# Patient Record
Sex: Female | Born: 1950
Health system: Southern US, Community
[De-identification: ages and names within clinical notes are randomized; demographics above are authoritative.]

## PROBLEM LIST (undated history)

## (undated) DIAGNOSIS — Z8049 Family history of malignant neoplasm of other genital organs: Secondary | ICD-10-CM

## (undated) DIAGNOSIS — Z8042 Family history of malignant neoplasm of prostate: Secondary | ICD-10-CM

## (undated) DIAGNOSIS — Z800 Family history of malignant neoplasm of digestive organs: Secondary | ICD-10-CM

## (undated) HISTORY — DX: Family history of malignant neoplasm of digestive organs: Z80.0

## (undated) HISTORY — PX: REDUCTION MAMMAPLASTY: SUR839

## (undated) HISTORY — DX: Family history of malignant neoplasm of other genital organs: Z80.49

## (undated) HISTORY — DX: Family history of malignant neoplasm of prostate: Z80.42

---

## 1998-06-13 ENCOUNTER — Emergency Department (HOSPITAL_COMMUNITY): Admission: EM | Admit: 1998-06-13 | Discharge: 1998-06-13 | Payer: Self-pay | Admitting: Emergency Medicine

## 1999-12-11 ENCOUNTER — Encounter: Admission: RE | Admit: 1999-12-11 | Discharge: 1999-12-11 | Payer: Self-pay | Admitting: Family Medicine

## 1999-12-11 ENCOUNTER — Encounter: Payer: Self-pay | Admitting: Family Medicine

## 2001-03-10 ENCOUNTER — Ambulatory Visit (HOSPITAL_COMMUNITY): Admission: RE | Admit: 2001-03-10 | Discharge: 2001-03-10 | Payer: Self-pay | Admitting: Gastroenterology

## 2002-02-09 ENCOUNTER — Encounter: Payer: Self-pay | Admitting: Family Medicine

## 2002-02-09 ENCOUNTER — Encounter: Admission: RE | Admit: 2002-02-09 | Discharge: 2002-02-09 | Payer: Self-pay | Admitting: Family Medicine

## 2003-08-22 ENCOUNTER — Encounter: Admission: RE | Admit: 2003-08-22 | Discharge: 2003-08-22 | Payer: Self-pay | Admitting: Family Medicine

## 2005-01-23 ENCOUNTER — Encounter: Admission: RE | Admit: 2005-01-23 | Discharge: 2005-01-23 | Payer: Self-pay | Admitting: Family Medicine

## 2005-08-25 ENCOUNTER — Ambulatory Visit: Admission: RE | Admit: 2005-08-25 | Discharge: 2005-08-25 | Payer: Self-pay | Admitting: Gynecologic Oncology

## 2005-09-08 ENCOUNTER — Inpatient Hospital Stay (HOSPITAL_COMMUNITY): Admission: RE | Admit: 2005-09-08 | Discharge: 2005-09-10 | Payer: Self-pay | Admitting: Obstetrics & Gynecology

## 2005-09-08 ENCOUNTER — Encounter (INDEPENDENT_AMBULATORY_CARE_PROVIDER_SITE_OTHER): Payer: Self-pay | Admitting: *Deleted

## 2005-10-20 ENCOUNTER — Ambulatory Visit: Admission: RE | Admit: 2005-10-20 | Discharge: 2005-10-20 | Payer: Self-pay | Admitting: Gynecologic Oncology

## 2006-04-28 ENCOUNTER — Ambulatory Visit: Admission: RE | Admit: 2006-04-28 | Discharge: 2006-04-28 | Payer: Self-pay | Admitting: Gynecologic Oncology

## 2006-04-28 ENCOUNTER — Other Ambulatory Visit: Admission: RE | Admit: 2006-04-28 | Discharge: 2006-04-28 | Payer: Self-pay | Admitting: Gynecologic Oncology

## 2006-07-19 ENCOUNTER — Ambulatory Visit (HOSPITAL_COMMUNITY): Admission: RE | Admit: 2006-07-19 | Discharge: 2006-07-19 | Payer: Self-pay | Admitting: Gynecologic Oncology

## 2006-08-31 ENCOUNTER — Ambulatory Visit (HOSPITAL_COMMUNITY): Admission: RE | Admit: 2006-08-31 | Discharge: 2006-08-31 | Payer: Self-pay | Admitting: Gynecologic Oncology

## 2006-11-24 ENCOUNTER — Encounter (INDEPENDENT_AMBULATORY_CARE_PROVIDER_SITE_OTHER): Payer: Self-pay | Admitting: Gynecologic Oncology

## 2006-11-24 ENCOUNTER — Ambulatory Visit: Admission: RE | Admit: 2006-11-24 | Discharge: 2006-11-24 | Payer: Self-pay | Admitting: Gynecologic Oncology

## 2006-11-24 ENCOUNTER — Other Ambulatory Visit: Admission: RE | Admit: 2006-11-24 | Discharge: 2006-11-24 | Payer: Self-pay | Admitting: Gynecologic Oncology

## 2007-02-01 ENCOUNTER — Ambulatory Visit (HOSPITAL_COMMUNITY): Admission: RE | Admit: 2007-02-01 | Discharge: 2007-02-01 | Payer: Self-pay | Admitting: Gynecologic Oncology

## 2007-02-08 ENCOUNTER — Other Ambulatory Visit: Admission: RE | Admit: 2007-02-08 | Discharge: 2007-02-08 | Payer: Self-pay | Admitting: Gynecologic Oncology

## 2007-02-08 ENCOUNTER — Encounter (INDEPENDENT_AMBULATORY_CARE_PROVIDER_SITE_OTHER): Payer: Self-pay | Admitting: Gynecologic Oncology

## 2007-02-08 ENCOUNTER — Ambulatory Visit: Admission: RE | Admit: 2007-02-08 | Discharge: 2007-02-08 | Payer: Self-pay | Admitting: Gynecologic Oncology

## 2007-09-27 ENCOUNTER — Encounter (INDEPENDENT_AMBULATORY_CARE_PROVIDER_SITE_OTHER): Payer: Self-pay | Admitting: Gynecologic Oncology

## 2007-09-27 ENCOUNTER — Other Ambulatory Visit: Admission: RE | Admit: 2007-09-27 | Discharge: 2007-09-27 | Payer: Self-pay | Admitting: Gynecologic Oncology

## 2007-09-27 ENCOUNTER — Ambulatory Visit: Admission: RE | Admit: 2007-09-27 | Discharge: 2007-09-27 | Payer: Self-pay | Admitting: Gynecologic Oncology

## 2008-10-10 ENCOUNTER — Other Ambulatory Visit: Admission: RE | Admit: 2008-10-10 | Discharge: 2008-10-10 | Payer: Self-pay | Admitting: Gynecologic Oncology

## 2008-10-10 ENCOUNTER — Ambulatory Visit: Admission: RE | Admit: 2008-10-10 | Discharge: 2008-10-10 | Payer: Self-pay | Admitting: Gynecologic Oncology

## 2008-10-10 ENCOUNTER — Encounter (INDEPENDENT_AMBULATORY_CARE_PROVIDER_SITE_OTHER): Payer: Self-pay | Admitting: Gynecologic Oncology

## 2009-10-10 ENCOUNTER — Other Ambulatory Visit: Admission: RE | Admit: 2009-10-10 | Discharge: 2009-10-10 | Payer: Self-pay | Admitting: Gynecologic Oncology

## 2009-10-10 ENCOUNTER — Ambulatory Visit: Admission: RE | Admit: 2009-10-10 | Discharge: 2009-10-10 | Payer: Self-pay | Admitting: Gynecologic Oncology

## 2010-10-09 ENCOUNTER — Ambulatory Visit: Payer: BC Managed Care – PPO | Attending: Gynecologic Oncology | Admitting: Gynecologic Oncology

## 2010-10-09 DIAGNOSIS — C549 Malignant neoplasm of corpus uteri, unspecified: Secondary | ICD-10-CM | POA: Insufficient documentation

## 2010-10-13 NOTE — Consult Note (Signed)
  NAMEMAKAYLIE, Kathy Drake             ACCOUNT NO.:  [id number]  MEDICAL RECORD NO.:  [id number]           PATIENT TYPE:  LOCATION:                                 FACILITY:  PHYSICIAN:  Laurette Schimke, MD          DATE OF BIRTH:  DATE OF CONSULTATION:  10/09/2010 DATE OF DISCHARGE:                                CONSULTATION   REASON FOR VISIT:  Surveillance for endometrial cancer.  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 No adjuvant therapy was administered, and she has remained without evidence of disease since.  PAST MEDICAL HISTORY:  Stage IA endometrial cancer, April 2007.  PAST SURGICAL HISTORY:  Cesarean section x2, bilateral breast reduction, bilateral tubal ligation, hysterectomy, bilateral salpingo-oophorectomy, bilateral pelvic lymph node dissection in April 2007.  MEDICATIONS:  None.  SOCIAL HISTORY:  Denies tobacco use.  Reports occasional alcohol use. Her daughter will be studying Arabic in [place] of a year.  FAMILY HISTORY:  No interval changes.  REVIEW OF SYSTEMS:  No headache, shortness of breath, cough, back pain, adenopathy, abdominal bloating, changes in appetite.  No bleeding from the vagina, uterus or bladder.  No constipation, diarrhea.  No nausea, vomiting.  No changes in urinary habits.  No unilateral extremity edema. Otherwise 10-point review of systems is negative.  PHYSICAL EXAMINATION:  GENERAL:  Well-developed female in no acute distress. VITAL SIGNS:  Weight 176 pounds, blood pressure 120/68, pulse of 78. CHEST:  Clear to auscultation. HEART:  Regular rate and rhythm.  There is no cervical, subclavicular, or inguinal adenopathy. ABDOMEN:  Obese, soft.  No palpable masses.  No evidence of ascites. BACK:  No CVA tenderness. PELVIC:  Normal external genitalia, Bartholin's, urethral, and Skene's. Atrophic vagina. RECTA:  Good anal sphincter  tone without any masses.  No cul-de-sac nodularity.  External hemorrhoids are noted.  Good anal sphincter tone. Edema 1+ of the lower extremities.  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  She has been advised to follow up with Dr. Arlyce Dice on annual basis.     Laurette Schimke, MD     WB/MEDQ  D:  10/09/2010  T:  10/09/2010  Job:  [id number]  cc:   Ilda Mori, M.D. Fax: [phone]  Telford Nab, R.N. [address] [place], [place] [address]  Electronically Signed by Laurette Schimke MD on 10/13/2010 12:58:25 PM

## 2010-10-21 NOTE — Consult Note (Signed)
NAME:  Kathy Drake, Kathy Drake             ACCOUNT NO.:  [id number]   MEDICAL RECORD NO.:  [id number]          PATIENT TYPE:  OUT   LOCATION:  GYN                          FACILITY:  [facility]   PHYSICIAN:  John T. Kyla Balzarine, M.D.    DATE OF BIRTH:  [birth year]   DATE OF CONSULTATION:  02/08/2007  DATE OF DISCHARGE:                                 CONSULTATION   CHIEF COMPLAINT:  Followup of endometrial cancer and pelvic lymphocyst.   HISTORY OF PRESENT ILLNESS:  This patient was explored and fully staged  of a clear cell carcinoma of the endometrium in April 2007.  She had  negative lymph nodes and no invasion but a poorly differentiated clear  cell carcinoma and was followed without treatment.  Last spring she had  a cystic mass on examination and ultrasound measuring up to 6 x 3.4 cm  and simple cyst thought to be either lymphocyst or seroma.  There was no  evidence of ascites, carcinomatosis or pathologic lymphadenopathy.  She  recently had a followup ultrasound which revealed a decrease in the size  of a largely simple cystic lesion.  She denies problems with lymphedema,  pelvic or abdominal pain, or vaginal bleeding.  She has questions about  colonoscopy screening as her last colonoscopy apparently yielded some  adenomatous polyps.   PAST MEDICAL HISTORY:  1. Bilateral breast reduction.  2. Cesarean section x2.  3. Tubal ligation.  4. Status post endometrial cancer surgery as above.   MEDICATIONS:  None.   ALLERGIES:  CODEINE causes rash.   PERSONAL AND SOCIAL HISTORY:  Married and denies tobacco but admits  social ethanol.   FAMILY HISTORY:  Mother with ovarian cancer at age 41 and subsequent  colon cancer.  Two paternal uncles with lymphoma and one with melanoma.   REVIEW OF SYSTEMS:  The patient is fully active and denies ongoing  issues, with a negative comprehensive system review otherwise.   PHYSICAL EXAMINATION:  VITAL SIGNS:  Weight 178 pounds; vital signs  stable and  afebrile.  GENERAL:  The patient is alert and oriented x3 in no acute distress.  LYMPHATIC SURVEY:  Negative for pathologic lymphadenopathy.  BACK:  No back or CVA tenderness.  ABDOMEN:  Obese, soft and benign with no ascites, mass or tenderness.  No hernias.  EXTREMITIES:  Full strength and range of motion with no edema, cords or  Homan's syndrome.  PELVIC:  External genitalia and BUS normal to inspection and palpation.  Bladder and urethra are well supported, and there are no vaginal mucosal  lesions.  On bimanual and rectovaginal examinations, left anterior  carrier cystic mass approximately 4 cm, nontender and with no associated  nodularity.  Uterus and cervix absent.   LABORATORY DATA:  Pelvic ultrasound obtained on August 26 reveals a  decrease in size of the cystic mass to 3.8 x 2.7 x 3.7 cm.  There  appeared to be acoustic shadowing and internal echo artifact as it was  surrounded by bowel loops.  No solid component identified and no free  fluid.   ASSESSMENT:  1. Endometrial carcinoma, NAD.  2.  Lymphocyst, decreasing in size.   PLAN:  1. The patient will continue follow up at 68-month intervals to      complete 2 years of followup.  2. She will see Dr. Arlyce Dice in 3 months and then return to see [place] in 6      months.      John T. Kyla Balzarine, M.D.  Electronically Signed     JTS/MEDQ  D:  02/08/2007  T:  02/08/2007  Job:  [id number]   cc:   Ilda Mori, M.D.  Fax: [phone]   Telford Nab, R.N.  [address]  [place], [place] [address]

## 2010-10-21 NOTE — Consult Note (Signed)
NAME:  Kathy Drake, Kathy Drake             ACCOUNT NO.:  [id number]   MEDICAL RECORD NO.:  [id number]          PATIENT TYPE:  OUT   LOCATION:  GYN                          FACILITY:  [facility]   PHYSICIAN:  John T. Kyla Balzarine, M.D.    DATE OF BIRTH:  03-Jun-1951   DATE OF CONSULTATION:  DATE OF DISCHARGE:                                 CONSULTATION   CHIEF COMPLAINT:  Followup of endometrial cancer, treated with surgery  alone.   HISTORY OF PRESENT ILLNESS:  This patient was explored and fully staged  of a clear cell carcinoma of the endometrium in April 2007.  She had no  invasion and negative lymph nodes but grade 3 lesion and we elected  followup without further treatment.  She was last seen here in April  2009 and had an interval examination by Dr. Arlyce Dice that was negative.  She relates no vaginal bleeding, change in bowel or bladder function,  pelvic pain, or new back pain.  She denies vaginal bleeding.  An interim  examination was negative.  She has minor concerns of a pulling  pressure sensation when she bends over in the left periumbilical region  but has not noted a hernia.  Of note, CT scan in the past revealed a  small cyst in the left pelvis, followed with no growth and asymptomatic.   PAST MEDICAL HISTORY:  1. Bilateral breast reduction.  2. Cesarean section x2.  3. Tubal ligation.  4. Endometrial cancer surgery as above.  No major comorbidities.   MEDICATIONS:  None.   ALLERGIES:  CODEINE CAUSES RASH.   PERSONAL AND SOCIAL HISTORY:  Married and denies tobacco but admits  social ethanol.   FAMILY HISTORY:  Mother died of ovarian +/- colon cancer.  Two paternal  uncles with lymphoma and one with melanoma.   REVIEW OF SYSTEMS:  Fully active without limitations.  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  Otherwise  negative comprehensive 10-point system review.   EXAM:  Weight 186 pounds and blood pressure 128/80; vital signs  otherwise  stable and afebrile.  The patient is anxious, alert, and  oriented x3 in no acute distress.  LYMPH:  Survey negative for pathologic lymphadenopathy.  BACK:  No spinous or CVA tenderness.  ABDOMEN:  Obese, soft, and benign with well-healed midline incision.  No  hernia, ascites, tenderness, or mass.  The patient was examined standing  and straining with no demonstrable hernia.  EXTREMITIES:  Have full strength and range of motion with no edema,  cords, or Homan's.  PELVIC:  External genitalia and BUS, bladder and urethra normal.  The  vaginal mucosa is clear.  On bimanual and rectovaginal examination,  limited by habitus.  There is some fullness in the left pelvic sidewall  adjacent to the vaginal apex, smooth, nontender, and unchanged from my  examination a year ago, compatible with documented lymphocyst.   ASSESSMENT:  1. Endometrial carcinoma, no evidence of disease.  2. Abdominal discomfort, low grade without clinical evidence for a      hernia.   PLAN:  Patient will see Dr. Arlyce Dice for followup in 6 months and return  here in 1 year.  I would recommend that we obtain a routine surveillance  CT scan prior to her return here; perhaps Dr. Arlyce Dice could make  arrangements for this next spring after her evaluation by him.      John T. Kyla Balzarine, M.D.  Electronically Signed     JTS/MEDQ  D:  10/10/2008  T:  10/10/2008  Job:  [id number]   cc:   Ilda Mori, M.D.  Fax: [phone]   Telford Nab, R.N.  [address]  [place], [place] [address]

## 2010-10-21 NOTE — Consult Note (Signed)
NAME:  Kathy Drake, Kathy Drake             ACCOUNT NO.:  [id number]   MEDICAL RECORD NO.:  [id number]          PATIENT TYPE:  OUT   LOCATION:  GYN                          FACILITY:  [facility]   PHYSICIAN:  John T. Kyla Balzarine, M.D.    DATE OF BIRTH:  Aug 07, 1950   DATE OF CONSULTATION:  09/27/2007  DATE OF DISCHARGE:                                 CONSULTATION   CHIEF COMPLAINT:  Follow up of clear cell endometrial adenocarcinoma.   HISTORY OF PRESENT ILLNESS:  This patient was explored and fully staged  of a clear cell carcinoma of the endometrium in April 2007 with negative  lymph nodes and no invasion but a grade 3 lesion.  She has small simple  cyst in the left pelvis found on a CT scan which has been followed with  no growth and no problems with lymphocyst.  Since her last evaluation,  she relates no vaginal bleeding, change in bowel or her bladder  function, pelvic pain or new back pain.  She is concerned about weight  gain.  Interim examination was negative.   PAST MEDICAL HISTORY:  1. Bilateral breast reduction.  2. Cesarean section x2.  3. Tubal ligation.  4. The endometrial cancer surgery as above.   MEDICATIONS:  None.   ALLERGIES:  CODEINE caused rash.   PERSONAL AND SOCIAL HISTORY:  Married and denies tobacco but admits  social ethanol.   FAMILY HISTORY:  Mother with ovarian cancer at age 15 and subsequent  colon cancer.  Two paternal uncles with lymphoma and one with melanoma.   REVIEW OF SYSTEMS:  Fully active and denies ongoing issues with a  negative comprehensive 10-point system review.   PHYSICAL EXAMINATION:  VITAL SIGNS:  Weight 180 pounds, blood pressure  115/67 and vital signs stable.  GENERAL:  The patient is alert and oriented x3 in no acute distress.  LYMPH SURVEY:  Negative for pathologic lymphadenopathy.  BACK:  No spinous or CVA tenderness.  ABDOMEN:  Obese, soft and benign with no hernia, ascites or mass.  No  abdominal tenderness.  EXTREMITIES:  Full  strength and range of motion without edema, cords or  Homan's.  PELVIC:  External genitalia and BUS normal to inspection and palpation.  Bladder and urethra are normal, and the vaginal mucosa is clear.  On  bimanual and rectovaginal examinations, there is fullness in the left  anterior pelvis, compatible with prior known lymphocyst.   ASSESSMENT:  1. Clear cell carcinoma of the endometrium, no evidence of disease.  2. Lymphocyst.   PLAN:  At this juncture, the patient has completed 2 years of followup,  and her risk of recurrence has dropped substantially.  Cytology is  obtained and will be communicated to the patient.  She will alternate  followup between Dr. Arlyce Dice and myself such that she sees each of [place]  once annually.      John T. Kyla Balzarine, M.D.  Electronically Signed     JTS/MEDQ  D:  09/27/2007  T:  09/27/2007  Job:  [id number]   cc:   Ilda Mori, M.D.  Fax: [phone]   Telford Nab,  R.N.  [address]  [place], [place] [address]

## 2010-10-21 NOTE — Consult Note (Signed)
NAME:  Kathy Drake, Kathy Drake             ACCOUNT NO.:  [id number]   MEDICAL RECORD NO.:  [id number]          PATIENT TYPE:  OUT   LOCATION:  GYN                          FACILITY:  [facility]   PHYSICIAN:  John T. Kyla Balzarine, M.D.    DATE OF BIRTH:  08/25/1950   DATE OF CONSULTATION:  DATE OF DISCHARGE:                                 CONSULTATION   CHIEF COMPLAINT:  Follow-up of a clear-cell endometrial adenocarcinoma  and apparent pelvic lymphocyst.   HISTORY OF PRESENT ILLNESS:  This patient was explored and fully staged  of a clear-cell carcinoma of endometrium in April 2007.  She had  negative lymph nodes and no invasion but a poorly differentiated clear-  cell carcinoma.  She has been followed without treatment and is doing  quite well, without abdominal, GI, GU or pelvic symptoms or vaginal  bleeding.  She denies lymphedema.  Last spring, she had a cystic mass on  examination and ultrasound.  CT scan revealed left pelvic mass measuring  6 x 3.4 cm compatible with a simple cyst thought to be on either  lymphocyst or residual postoperative seroma.  There was no evidence of  ascites, carcinomatosis or pathologic lymphadenopathy.   PAST MEDICAL HISTORY:  Bilateral breast reduction, cesarean section x2  and tubal ligation.  Post endometrial cancer surgery as above.   MEDICATIONS:  None.   ALLERGIES:  CODEINE CAUSES RASH.   PERSONAL AND SOCIAL HISTORY:  Married and denies tobacco but admits  social ethanol.   FAMILY HISTORY:  Mother with ovarian cancer at age 19 and subsequent  colon cancer.  Two paternal uncles with lymphoma and one with melanoma.   REVIEW OF SYSTEMS:  The patient is a full activity and denies problems,  with a negative comprehensive systems review otherwise.   PHYSICAL EXAMINATION:  Weight 176 pounds; vitals signs stable and  afebrile.  The patient is alert and oriented x3 in no acute distress.  LYMPHATIC SURVEY:  Negative for pathologic lymphadenopathy.  BACK:  No  back or CVA tenderness.  ABDOMEN:  Obese, soft and benign with no ascites, mass or tenderness.  No hernia.  EXTREMITIES:  The patient has full strength and range of motion with no  edema, cords or Homan's syndrome.  PELVIC:  External genitalia and BUS normal to inspection and palpation.  Bladder and urethra are well-supported, and there are no vaginal mucosal  lesions.  On bimanual and rectovaginal examinations, there is a left  anterior cystic mass approximately 6 cm, nontender and with no  associated nodularity.  Uterus and cervix absent.   ASSESSMENT:  Essentially stable pelvic likely lymphocyst   PLAN:  Patient reassured regarding her exam and Pap smear obtained.  She  can be seen for follow-up in 2-3 months after pelvic ultrasound to  confirm the cyst is stable.      John T. Kyla Balzarine, M.D.  Electronically Signed     JTS/MEDQ  D:  11/24/2006  T:  11/24/2006  Job:  [id number]   cc:   Ilda Mori, M.D.  Fax: [phone]   Telford Nab, R.N.  [address] [facility]  [place], [place] [address]

## 2010-10-24 NOTE — Op Note (Signed)
Kathy Drake, Kathy Drake             ACCOUNT NO.:  [id number]   MEDICAL RECORD NO.:  [id number]          PATIENT TYPE:  INP   LOCATION:  1616                         FACILITY:  [facility]   PHYSICIAN:  John T. Kyla Balzarine, M.D.    DATE OF BIRTH:  [birth year]   DATE OF PROCEDURE:  09/08/2005  DATE OF DISCHARGE:                                 OPERATIVE REPORT   SURGEON:  Ronita Hipps MD   ASSISTANT:  Ilda Mori, M.D.   PREOPERATIVE DIAGNOSIS:  Clear cell adenocarcinoma of the endometrium.   POSTOPERATIVE DIAGNOSIS:  Clear cell adenocarcinoma of the endometrium.   PROCEDURE:  Total abdominal hysterectomy, bilateral salpingo-oophorectomy  fracture bilateral, pelvic and para-aortic lymphadenectomy, omental and  pelvic biopsies, peritoneal cytology.   ANESTHESIA:  General endotracheal   INDICATIONS FOR SURGERY AND FINDINGS:  This 60 year old woman presented with  bleeding and endometrial biopsy revealed endometrial carcinoma, likely clear  cell type.  Prior to surgery, she had consented to participate on GOG  protocol.   Examination under anesthesia disclosed a small uterus with no adnexal masses  or cul-de-sac nodularity.  On exploration, there was no evidence of ascites,  carcinomatosis, or palpable lymphadenopathy.  There was a small peritoneal  implant in the anterior cul-de-sac; benign peritoneal inclusion cyst on  frozen section.  The uterus, tubes and ovaries looked grossly normal with  some scarring of the bladder to the anterior uterus secondary to prior  cesarean section; prior tubal ligation noted.  On frozen section, she had a  polypoid endometrial malignancy with apparently no evidence of myometrial  invasion.  The lymph nodes were not palpably enlarged.   DESCRIPTION OF PROCEDURE:  The patient was prepped and draped in the low  lithotomy position using direct placement stirrups and a Foley catheter was  sterilely placed after examination under anesthesia revealed the above  findings.  Exploration was carried out via a midline incision, extended to  the left of the umbilicus into the mid epigastrium.  The abdomen was  manually and visually explored, with normal upper abdominal contents noted,  including appendix.  Because of the clear cell histology, partial  omentectomy was performed using electrocautery to control the vascular  pedicles of the infracolic omentum.  A Bookwalter retractor was placed and  the bowel was packed out of pelvis.  The cornua of the uterus were grasped  bilaterally with long Kelly clamps.  Total abdominal hysterectomy with  bilateral salpingo-oophorectomy was performed using 2-0 Vicryl for all  sutures and ligatures unless otherwise specified.   The round ligaments bilaterally were divided with electrocautery and  pararectal spaces partially developed bilaterally, identifying each ureter.  The infundibulopelvic ligaments were skeletonized above the ureter,  crossclamped, divided and doubly ligated with free tie and suture ligature.  The anterior peritoneal reflection of the uterus was opened.  There was  scarring, particularly on the left between the bladder flap and anterior  uterus.  Electrocautery was used where necessary to achieve hemostasis  during mobilization bladder.  The uterine vessels bilaterally were  skeletonized, crossclamped, divided and suture ligated.  Alternating on the  right and left  sides, cardinal and uterosacral ligaments were clamped  adjacent to the cervix, divided and suture ligated.  The final pedicles  entered the lateral vaginal fornices and the uterus was amputated from the  vaginal cuff.  Frozen section was obtained from the small implant in the  anterior cul-de-sac and returned negative for metastatic disease, along with  a polypoid uterine malignancy.   Bilateral selective pelvic lymphadenectomies were performed using sharp and  blunt dissection with electrocautery for hemostasis.  All lymph nodes   anterior and medial to the external iliac vessels, along the hypogastric  artery and from the obturator space, were harvested, preserving the vital  structures such as obturator nerve and pronator nerve and ureter.  The  peritoneal gutters of the ascending and descending colon were mobilized and  the colon and ureter mobilized anteriorly and medially with the Bookwalter  retractor, first on the left side and then on the right side, to expose the  common iliac vessels and lower aorta and vena cava.  All lymphatic tissue  anterior and lateral to the common iliac artery and the aorta was removed,  using electrocautery and hemoclips for hemostasis.  The dissection extended  to approximately 1 cm above the level of the inferior mesenteric artery and  cleared all lymphatic tissue from the surface of the vena cava and  aortocaval region.   Sponges and retractors were removed.  The appendix was specifically  visualized and found to be normal and there were no metastatic lesions along  the small bowel mesentery or serosa.  The pelvis was copiously irrigated  with additional hemostasis where needed for electrocautery.  The patient  tolerated the procedure well and total estimated blood loss was 300 mL.  After removing sponges and retractors, the anterior rectus fascia was closed  in a running mass closure of 0 PDS and skin with skin clips.  The patient  was returned to the recovery room in stable condition.  Sponge and sponge  counts correct x2.   PATHOLOGY SPECIMEN:  Uterus with bilateral tubes and ovaries and cervix,  bilateral external iliac and obturator lymph nodes, bilateral common iliac  and aortic lymph nodes, omentum, peritoneal washings, peritoneal biopsy.      John T. Kyla Balzarine, M.D.  Electronically Signed     JTS/MEDQ  D:  09/08/2005  T:  09/08/2005  Job:  [id number]   cc:   Ilda Mori, M.D.  Fax: [phone]  Telford Nab, R.N.  [address]  [place], [place] [address]    Teena Irani. Arlyce Dice, M.D.  Fax: [phone]

## 2010-10-24 NOTE — Procedures (Signed)
[facility]  Patient:    Kathy Drake, Kathy Drake Visit Number: [id number] MRN: [id number]          Service Type: END Location: ENDO Attending Physician:  Charna Audriana Dictated by:   Anselmo Rod, M.D. Proc. Date: 03/10/01 Admit Date:  03/10/2001   CC:         Teena Irani. Arlyce Dice, M.D.   Procedure Report  DATE OF BIRTH:  11/19/1950  REFERRING PHYSICIAN:  Teena Irani. Arlyce Dice, M.D.  PROCEDURE PERFORMED:  Colonoscopy.  ENDOSCOPIST:  Anselmo Rod, M.D.  INSTRUMENT USED:  Olympus pediatric video colonoscope.  INDICATIONS FOR PROCEDURE:  The patient is a 60 year old white female with a family history of colon cancer (mother had rectal cancer), blood in stool. Rule out colonic polyps, masses, hemorrhoids, etc.  PREPROCEDURE PREPARATION:  Informed consent was procured from the patient. The patient was fasted for eight hours prior to the procedure and prepped with a bottle of magnesium citrate and a gallon of NuLytely the night prior to the procedure.  PREPROCEDURE PHYSICAL:  The patient had stable vital signs.  Neck supple. Chest clear to auscultation.  S1, S2 regular.  Abdomen soft with normal abdominal bowel sounds.  DESCRIPTION OF PROCEDURE:  The patient was placed in the left lateral decubitus position and sedated with 75 mcg of fentanyl and 8 mg of Versed intravenously.  Once the patient was adequately sedated and maintained on low-flow oxygen and continuous cardiac monitoring, the Olympus video colonoscope was advanced from the rectum to the cecum without difficulty. The entire colonic mucosa appeared healthy and without lesions except for small external hemorrhoid seen on anal inspection and questionable extrinsic compression of the cecum which may be from fibroid, etc.  No other abnormalities were seen.  The patient tolerated the procedure well without complication.  IMPRESSION: 1. Small external hemorrhoid. 2. Extrinsic compression  from the cecum.  RECOMMENDATIONS: 1. The patient has been advised to follow up with her gynecologist. 2. Repeat colorectal cancer screening is recommended in the next five    years unless the patient were to develop any abnormal symptoms in the    interim. 3. High fiber diet has been suggested with liberal fluid intake. 4. If her rectal bleeding does not stop, she might require excision of the    external hemorrhoid. Dictated by:   Anselmo Rod, M.D. Attending Physician:  Charna Tylasia DD:  03/10/01 TD:  03/11/01 Job: [id number]

## 2010-10-24 NOTE — Consult Note (Signed)
NAME:  BEVERLY, FERNER             ACCOUNT NO.:  [id number]   MEDICAL RECORD NO.:  [id number]          PATIENT TYPE:  OUT   LOCATION:  GYN                          FACILITY:  [facility]   PHYSICIAN:  Paola A. Duard Brady, MD    DATE OF BIRTH:  19-Jan-1951   DATE OF CONSULTATION:  08/25/2005  DATE OF DISCHARGE:                                   CONSULTATION   REFERRING PHYSICIAN:  Dr. Ilda Mori at [facility]; [address]; [place], [place] [address].   The patient is seen in consultation today at the request of Dr. Arlyce Dice.  Ms.  Mclouth is a very pleasant 60 year old gravida 2, para 2, who over the past  several months has been having increasing stress at work.  She states that  the stress increased to a point where about 2 weeks ago she actually injured  her jaw bone from grinding her teeth so much.  Concomitantly to that about 2  weeks ago, she noticed that she had a light period for 4-5 days.  She was  seen by Dr. Arlyce Dice for this and underwent an endometrial biopsy in his  office on August 18, 2005.  Pathology returned as a poorly differentiated  carcinoma with clear cell features.  The tumor had positive staining for P53  and KI67.  It was felt that this was most likely consistent with a clear  cell carcinoma, as she had ER and PR, positive KI67 and patchy P53  straining.  She comes in today for evaluation.  Ms. Vallin has been  menopausal for the past 5-6 years.  She did take Prempro for a few years,  but has not taken it for many years.  As a result of not stopping her  Prempro, she has noticed some increased hot flashes.  As stated, she has had  increased stress recently and this is what she was attributing the bleeding  to until the biopsy returned.  She, as stated above, had a light period for  4-5 days.  There was pain or cramping associated with it.  She has had some  loose stools of note, but that seems to go along with her being on Zoloft  for the past 6 weeks for hot flashes.  She denies any blood in her stool.   REVIEW OF SYSTEMS:  She denies any chest pain, shortness of breath, nausea,  vomiting, fevers, chills, headaches or visual changes.  She denies any  unintentional weight loss, weight gain, early satiety, or abdominal  bloating.  She has noticed a little bit of increased fatigue, for which she  took iron, but she states that this goes along with her increased stress at  work.  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   PAST MEDICAL HISTORY:  None.   PAST SURGICAL HISTORY:  Bilateral breast reduction in 1980.  She has had 2  cesarean sections in 1986 and 1989, and she had a tubal ligation.   ALLERGIES:  CODEINE, WHICH CAUSES RASH AND SWELLING.  SOCIAL HISTORY:  She has been married for 29 years.  She denies use of  tobacco.  She drinks alcohol socially.  She is a lay [place].  She has a son  who is 37 and a daughter, who is a Holiday representative in high school.   MEDICATIONS:  Zoloft for the past 6 weeks, calcium and iron supplements.   FAMILY HISTORY:  Mother had ovarian cancer at the age of 21.  She had colon  cancer twice, skin cancer and depression.  She has 2 paternal uncles with  lymphoma and 1 paternal uncle with melanoma.  She has a sister, who suffers  from depression.   HEALTH MAINTENANCE:  She is due for her mammogram.  It was negative last  year.  She had a colonoscopy a few years ago that was negative.  She has  also had an ultrasound in August of 2006 secondary to her family history of  ovarian cancer, which was unremarkable.   PHYSICAL EXAMINATION:  VITAL SIGNS:  Weight 167 pounds.  GENERAL:  Well-nourished, well-developed female in no acute distress.  NECK:  Supple.  There is no lymphadenopathy and no thyromegaly.  LUNGS:  Clear to auscultation bilaterally.  CARDIOVASCULAR EXAM:  Regular rate and rhythm.  ABDOMEN:  Soft, nontender and nondistended.   There are no palpable masses or  hepatosplenomegaly.  GROINS:  Negative for adenopathy.  EXTREMITIES:  There is no edema.  PELVIC:  External genitalia is within normal limits.  The vagina is well  epithelialized.  The cervix is visualized.  It is nulliparous.  There is a  physiologic discharge.  There are new visible lesions.  Bimanual  examination:  The cervix is palpably normal, the corpus is normal size,  shape and consistency.  I cannot appreciate any adnexal masses.  RECTAL:  Confirms there is no nodularity.   ASSESSMENT:  A 60 year old with clear cell carcinoma of the uterus.  I  discussed with the patient that clinically, she is a stage I.  However, the  concern is she is a grade III and we need to proceed with appropriate  staging.  She is scheduled for surgery for September 08, 2005.  She understands  we will be proceeding with a total abdominal hysterectomy, bilateral  salpingo-oophorectomy with pelvic and para-aortic lymphadenectomy, possible  peritoneal biopsies and omentectomy.  I did discuss with her the  significance of her clear cell pathology.  Her questions regarding the  surgery were discussed.  She was shown where the para-aortic lymphadenectomy  will be performed on diagrams that we have in the office.  We will check a  CA125 preoperatively when she has her preop labs.  She knows that pending  the final reports of both the endometrium, as well as depth of myometrial  stoma invasion and final staging, that she may require adjuvant therapy  either radiation, chemotherapy or a combination of both.  This was discussed  with her.   1.  All her questions were elicited and answered to her satisfaction.  2.  She was given a letter for the airlines, as she was planning on      traveling to [facility] on April 3.  She knows that this will be canceled.  3.  She knows that she will have surgery with Dr. Kyla Balzarine and Dr. Arlyce Dice, as I     will not be here that day.  4.  Risks including  injury of surrounding organs and bleeding were discussed      with  the patient, and she acknowledges these and wishes to proceed with      surgery.      Paola A. Duard Brady, MD  Electronically Signed     PAG/MEDQ  D:  08/25/2005  T:  08/26/2005  Job:  [id number]   cc:   Ilda Mori, M.D.  Fax: [phone]   Telford Nab, R.N.  [address]  [place], [place] [address]

## 2010-10-24 NOTE — Consult Note (Signed)
NAME:  Kathy Drake, Kathy Drake             ACCOUNT NO.:  [id number]   MEDICAL RECORD NO.:  [id number]          PATIENT TYPE:  OUT   LOCATION:  GYN                          FACILITY:  [facility]   PHYSICIAN:  John T. Kyla Balzarine, M.D.    DATE OF BIRTH:  Nov 21, 1950   DATE OF CONSULTATION:  10/20/2005  DATE OF DISCHARGE:                                   CONSULTATION   CHIEF COMPLAINT:  Postoperative followup after TAH/BSO and lymph node  sampling for clear cell endometrial adenocarcinoma.   HISTORY OF PRESENT ILLNESS:  The patient was explored and fully stage for  clear cell carcinoma of the endometrium on September 08, 2005.  She was found to  have a noninvasive poorly differentiated clear cell carcinoma with negative  washings and multiple biopsies including the 26 lymph nodes (stage IA, grade  3 disease).  Based on lack of myometrial involvement, it was recommended  that she have no further treatment.  Her postoperative convalescence has  proceeded well and she is gradually returning to full activity.   PAST MEDICAL HISTORY:  Significant for bilateral breast reduction, 2  cesarean sections and tubal ligation.  She is status post endometrial cancer  surgery as above.   ALLERGIES:  CODEINE causes rash.   MEDICATIONS:  Currently none.   PERSONAL AND SOCIAL HISTORY:  Married, denies tobacco and admits social  ethanol.   FAMILY HISTORY:  Mother with ovarian cancer at age 19 and colon cancer.  Two  paternal uncles with lymphoma and 1 with melanoma.  Sister with no breast or  gynecologic malignancies.   REVIEW OF SYSTEMS:  The patient is gradually regaining full function.  She  has minimal bladder irritability.  She denies vaginal bleeding or leg  swelling.   EXAM:  VITAL SIGNS:  Weight 160.5 pounds, blood pressure 110/78; afebrile.  GENERAL:  The patient is alert and oriented x3, in no acute distress.  LYMPHATIC SURVEY:  Negative.  BACK:  There is no back or CVA tenderness.  ABDOMEN:  soft and benign  with well-healed incision.  No ascites,  tenderness, masses or organomegaly.  EXTREMITIES:  Full strength and range of motion with no edema.  PELVIC:  External genitalia and BUS are normal to inspection and palpation.  The bladder and urethra are normal.  Vaginal mucosa without lesions; healing  cuff.  Bimanual and rectovaginal examinations reveal absent uterus and  cervix with expected postop induration at the cuff, but no adnexal mass or  parametrial nodularity.   ASSESSMENT:  Stage IA clear cell endometrial adenocarcinoma.   PLAN:  We can begin alternating followup with Dr. Arlyce Dice at 72-month  intervals (each seeing the patient at 38-month intervals) for the first 2  years, and then at 74-month intervals in an alternating fashion to complete 5  years of followup.  We discussed symptoms which should prompt an earlier  reevaluation.      John T. Kyla Balzarine, M.D.  Electronically Signed     JTS/MEDQ  D:  10/20/2005  T:  10/21/2005  Job:  [id number]   cc:   Ilda Mori, M.D.  Fax: [phone]  Telford Nab, R.N.  [address]  [place], [place] [address]

## 2010-10-24 NOTE — Discharge Summary (Signed)
NAMEALISEN, MARSIGLIA             ACCOUNT NO.:  [id number]   MEDICAL RECORD NO.:  [id number]          PATIENT TYPE:  INP   LOCATION:  1616                         FACILITY:  [facility]   PHYSICIAN:  Ilda Mori, M.D.   DATE OF BIRTH:  May 27, 1951   DATE OF ADMISSION:  09/08/2005  DATE OF DISCHARGE:  09/10/2005                                 DISCHARGE SUMMARY   FINAL DIAGNOSIS:  Clear cell endometrial carcinoma, FIGO stage IA.   SECONDARY DIAGNOSIS:  Tobacco use disorder.   PROCEDURE:  Total abdominal hysterectomy, bilateral salpingo-oophorectomy,  regional lymph node dissection and peritoneal biopsy.   COMPLICATIONS:  None.   CONDITION ON DISCHARGE:  Good.   HISTORY:  This is a 60 year old, gravida 2, para 2, who was found to have a  clear cell endometrial carcinoma on a [place] uterine aspiration that was  done in the office of Dr. Ilda Mori for the symptom of postmenopausal  bleeding.  The patient had had no previous problems and presented as soon as  she had an episode of postmenopausal bleeding.  The patient had no other  significant past medical history, and was evaluated by Dr. Cleda Mccreedy of  the GYN Oncology Department from [facility] prior to admission.   HOSPITAL COURSE:  She was brought to the operating room on the day of  admission where a total abdominal hysterectomy, bilateral salpingo-  oophorectomy, and lymph node dissection was performed by Dr. Ronita Hipps.  The reason for the extensive surgery was due to the fact that the lesion was  felt to be high-grade clear cell carcinoma, and, for that reason, lymph node  dissection was indicated.  The patient's operation went without  complications, and her postoperative course was benign.  On the second  postoperative day, the patient was passing flatus.  Her pain was controlled  with oral analgesia.  She was urinating without problems, and was felt to be  ready for discharge.  She was discharged on a regular diet, told  to limit  her activity, and was asked to return to the office in 3 days for removal of  her staples.  She was given Percocet, 30 tablets, to take 1-2 every 4 hours  for pain, and, as noted earlier, was asked to return to the office for  staple removal in 3 days.   LABORATORY DATA:  Her omentum was negative for carcinoma.  Her peritoneal  biopsy of the bladder flap showed a benign inclusion cyst.  Her uterus  showed poorly differentiated carcinoma consistent with clear cell carcinoma,  1.2 cm, with no invasion identified.  The myometrium did show some  adenomyosis that was negative for carcinoma.  The right ovary and fallopian  tube and the left ovary and fallopian tube were normal.  External iliac,  obturator, common iliac bilaterally were all negative for cancer.  Left and  right aortic and periaortic lymph nodes were also negative for cancer.  Peritoneal washings were negative for cancer.  Final diagnosis was  surgically staged IA.   Other laboratory findings revealed an admission hemoglobin of 12.7, white  count of 7600.  Postoperatively, her hemoglobin was 10.6 with a white count  of 13.3.  She had a complete metabolic profile which was essentially within  normal limits.  Blood type was A positive.  An EKG preoperatively was read  as sinus bradycardia; otherwise, normal EKG.  A preoperative chest x-ray  showed no acute cardiopulmonary processes.      Ilda Mori, M.D.  Electronically Signed     RK/MEDQ  D:  09/28/2005  T:  09/29/2005  Job:  [id number]   cc:   Telford Nab, R.N.  [address]  [place], [place] [address]

## 2010-10-24 NOTE — Consult Note (Signed)
NAME:  Kathy Drake, Kathy Drake             ACCOUNT NO.:  [id number]   MEDICAL RECORD NO.:  [id number]          PATIENT TYPE:  OUT   LOCATION:  GYN                          FACILITY:  [facility]   PHYSICIAN:  John T. Kyla Balzarine, M.D.    DATE OF BIRTH:  1951/05/21   DATE OF CONSULTATION:  04/28/2006  DATE OF DISCHARGE:                                 CONSULTATION   CHIEF COMPLAINT:  Follow-up of clear cell endometrial adenocarcinoma.   HISTORY OF PRESENT ILLNESS:  This patient was explored and fully staged  of a clear cell carcinoma of the endometrium in April 2007. She had a  noninvasive poorly differentiated clear cell carcinoma with negative  washings and multiple biopsies including 26 lymph nodes (stage Ia, grade  3 disease). It is recommended that she have no further treatment. She is  doing quite well and denies abdominal, GI, GU or pelvic symptoms. She  denies pelvic pain or back pain and denies vaginal bleeding. She denies  lymphedema.   PAST MEDICAL HISTORY:  Bilateral breast reduction, cesarean section x2,  and tubal ligation. She is status post endometrial cancer surgery as  above.   MEDICATIONS:  Currently none.   ALLERGIES:  CODEINE causes rash.   PERSONAL AND SOCIAL HISTORY:  Married. Denies tobacco and admits social  ethanol.   FAMILY HISTORY:  Mother with ovarian cancer at age 2 and subsequent  colon cancer. Two paternal uncles with lymphoma and one with melanoma.  Sister with no breast or gynecologic malignancies.   REVIEW OF SYSTEMS:  Other than noted above negative in comprehensive 10-  point review.   PHYSICAL EXAMINATION:  VITAL SIGNS:  Weight 172 pounds, vital signs  stable, afebrile.  GENERAL:  The patient is anxious, alert, and oriented x3 in no acute  distress.  LYMPHATIC SURVEY:  Negative for pathologic lymphadenopathy.  BACK:  No back or CVA tenderness.  ABDOMEN:  Soft and benign with well-healed incision. No ascites,  tenderness, mass or hernia.  EXTREMITIES:   Full strength and range of  motion with no edema, tenderness or Homans.  PELVIC:  External genitalia and BUS are normal to inspection and  palpation. Bladder and urethra are normal. Vaginal mucosa without  lesions with well-healed cuff. Bimanual and rectovaginal examinations  reveal absent uterus and cervix with no mass or nodularity.   ASSESSMENT:  Stage Ia clear cell endometrial adenocarcinoma.   PLAN:  The patient will continue to be followed alternating at three  month intervals until she has completed a year of follow-up. She will  see Dr. Arlyce Dice for follow-up in three months and return here in six  months.      John T. Kyla Balzarine, M.D.  Electronically Signed     JTS/MEDQ  D:  04/28/2006  T:  04/28/2006  Job:  [id number]   cc:   Ilda Mori, M.D.  Fax: [phone]   Telford Nab, R.N.  [address]  [place], [place] [address]

## 2010-11-11 ENCOUNTER — Telehealth: Payer: Self-pay | Admitting: Internal Medicine

## 2010-11-11 NOTE — Telephone Encounter (Signed)
Pt was sch for np ov on 7/31, but is having to rsc. Pt was referred by to by some pts of yours. Pt req to be worked sooner [employer]. Pls advise.

## 2010-11-12 NOTE — Telephone Encounter (Signed)
Spoke to pt and explained that we don't have anything any soonier than Oct. But that I would be more than happy to schedule her for then. Pt states that's fine and hung up.

## 2011-01-06 ENCOUNTER — Ambulatory Visit: Payer: Self-pay | Admitting: Internal Medicine

## 2012-07-07 ENCOUNTER — Telehealth: Payer: Self-pay | Admitting: Oncology

## 2012-07-07 NOTE — Telephone Encounter (Signed)
Called patient and left message with husband to call me back and patient called back regarding follow-up to the research study that she is on.  She stated that she is doing fine.  She saw her GYN last week but has not gotten all her results back yet, but thinks everything is fine.

## 2013-08-17 ENCOUNTER — Telehealth: Payer: Self-pay | Admitting: Oncology

## 2013-08-17 NOTE — Telephone Encounter (Signed)
On 08/15/13 I called and left a message for patient to give me a call back to let me know now she was doing. On 08/17/13 patient returned my call. She stated she was doing wonderful.  She was healthy and had seen her GYN MD just recently.  I will request those records.   She thanked me for calling her and I thanked her for her participation in this clinical trial.

## 2014-08-20 ENCOUNTER — Telehealth: Payer: Self-pay | Admitting: Oncology

## 2014-08-20 NOTE — Telephone Encounter (Signed)
08/20/14 - 2:00 pm - Called patient regarding follow-up to the GOG 0210 study.  She states she is doing great.  No problems.  She has scheduled her yearly visit with Dr. Deatra Ina. She is a grandmother now and her daughter is getting married soon.  I thanked her for her continued support of this clinical trial. Remer Macho 08/20/14 - 2 pm

## 2015-09-26 ENCOUNTER — Telehealth: Payer: Self-pay | Admitting: Oncology

## 2015-09-26 NOTE — Telephone Encounter (Signed)
09/26/15 - Called patient on the phone.  Patient states that she is doing great.  No problems.  She is now 10 years out from surgery for the study. She stills sees Dr. Deatra Ina yearly.  I thanked the patient for her support in this clinical trial. Remer Macho 09/26/15 - 1:50 pm

## 2017-09-01 DIAGNOSIS — R69 Illness, unspecified: Secondary | ICD-10-CM | POA: Diagnosis not present

## 2017-09-03 DIAGNOSIS — H2513 Age-related nuclear cataract, bilateral: Secondary | ICD-10-CM | POA: Diagnosis not present

## 2017-09-10 DIAGNOSIS — E7800 Pure hypercholesterolemia, unspecified: Secondary | ICD-10-CM | POA: Diagnosis not present

## 2017-09-10 DIAGNOSIS — M778 Other enthesopathies, not elsewhere classified: Secondary | ICD-10-CM | POA: Diagnosis not present

## 2017-09-10 DIAGNOSIS — Z23 Encounter for immunization: Secondary | ICD-10-CM | POA: Diagnosis not present

## 2017-09-10 DIAGNOSIS — R739 Hyperglycemia, unspecified: Secondary | ICD-10-CM | POA: Diagnosis not present

## 2017-09-10 DIAGNOSIS — E559 Vitamin D deficiency, unspecified: Secondary | ICD-10-CM | POA: Diagnosis not present

## 2017-09-10 DIAGNOSIS — Z79899 Other long term (current) drug therapy: Secondary | ICD-10-CM | POA: Diagnosis not present

## 2017-09-10 DIAGNOSIS — B359 Dermatophytosis, unspecified: Secondary | ICD-10-CM | POA: Diagnosis not present

## 2017-09-10 DIAGNOSIS — R69 Illness, unspecified: Secondary | ICD-10-CM | POA: Diagnosis not present

## 2017-09-10 DIAGNOSIS — Z0000 Encounter for general adult medical examination without abnormal findings: Secondary | ICD-10-CM | POA: Diagnosis not present

## 2017-09-10 DIAGNOSIS — Z8542 Personal history of malignant neoplasm of other parts of uterus: Secondary | ICD-10-CM | POA: Diagnosis not present

## 2018-03-01 DIAGNOSIS — E7800 Pure hypercholesterolemia, unspecified: Secondary | ICD-10-CM | POA: Diagnosis not present

## 2018-03-01 DIAGNOSIS — R739 Hyperglycemia, unspecified: Secondary | ICD-10-CM | POA: Diagnosis not present

## 2018-04-12 DIAGNOSIS — R69 Illness, unspecified: Secondary | ICD-10-CM | POA: Diagnosis not present

## 2018-04-15 DIAGNOSIS — Z23 Encounter for immunization: Secondary | ICD-10-CM | POA: Diagnosis not present

## 2018-10-07 DIAGNOSIS — E7800 Pure hypercholesterolemia, unspecified: Secondary | ICD-10-CM | POA: Diagnosis not present

## 2018-10-07 DIAGNOSIS — Z79899 Other long term (current) drug therapy: Secondary | ICD-10-CM | POA: Diagnosis not present

## 2018-10-07 DIAGNOSIS — R7301 Impaired fasting glucose: Secondary | ICD-10-CM | POA: Diagnosis not present

## 2018-10-07 DIAGNOSIS — E559 Vitamin D deficiency, unspecified: Secondary | ICD-10-CM | POA: Diagnosis not present

## 2018-10-11 DIAGNOSIS — G4700 Insomnia, unspecified: Secondary | ICD-10-CM | POA: Diagnosis not present

## 2018-10-11 DIAGNOSIS — E559 Vitamin D deficiency, unspecified: Secondary | ICD-10-CM | POA: Diagnosis not present

## 2018-10-11 DIAGNOSIS — R69 Illness, unspecified: Secondary | ICD-10-CM | POA: Diagnosis not present

## 2018-10-11 DIAGNOSIS — Z8542 Personal history of malignant neoplasm of other parts of uterus: Secondary | ICD-10-CM | POA: Diagnosis not present

## 2018-10-11 DIAGNOSIS — E7800 Pure hypercholesterolemia, unspecified: Secondary | ICD-10-CM | POA: Diagnosis not present

## 2018-10-11 DIAGNOSIS — R7301 Impaired fasting glucose: Secondary | ICD-10-CM | POA: Diagnosis not present

## 2018-10-11 DIAGNOSIS — Z90710 Acquired absence of both cervix and uterus: Secondary | ICD-10-CM | POA: Diagnosis not present

## 2018-10-11 DIAGNOSIS — Z0000 Encounter for general adult medical examination without abnormal findings: Secondary | ICD-10-CM | POA: Diagnosis not present

## 2018-11-08 DIAGNOSIS — Z800 Family history of malignant neoplasm of digestive organs: Secondary | ICD-10-CM | POA: Diagnosis not present

## 2018-11-08 DIAGNOSIS — K5730 Diverticulosis of large intestine without perforation or abscess without bleeding: Secondary | ICD-10-CM | POA: Diagnosis not present

## 2018-11-08 DIAGNOSIS — Z1211 Encounter for screening for malignant neoplasm of colon: Secondary | ICD-10-CM | POA: Diagnosis not present

## 2018-11-08 DIAGNOSIS — Z86010 Personal history of colonic polyps: Secondary | ICD-10-CM | POA: Diagnosis not present

## 2018-12-21 DIAGNOSIS — K5140 Inflammatory polyps of colon without complications: Secondary | ICD-10-CM | POA: Diagnosis not present

## 2018-12-21 DIAGNOSIS — D125 Benign neoplasm of sigmoid colon: Secondary | ICD-10-CM | POA: Diagnosis not present

## 2018-12-21 DIAGNOSIS — Z1211 Encounter for screening for malignant neoplasm of colon: Secondary | ICD-10-CM | POA: Diagnosis not present

## 2018-12-21 DIAGNOSIS — Z800 Family history of malignant neoplasm of digestive organs: Secondary | ICD-10-CM | POA: Diagnosis not present

## 2018-12-21 DIAGNOSIS — K635 Polyp of colon: Secondary | ICD-10-CM | POA: Diagnosis not present

## 2018-12-21 DIAGNOSIS — D122 Benign neoplasm of ascending colon: Secondary | ICD-10-CM | POA: Diagnosis not present

## 2018-12-21 DIAGNOSIS — D123 Benign neoplasm of transverse colon: Secondary | ICD-10-CM | POA: Diagnosis not present

## 2018-12-25 DIAGNOSIS — R69 Illness, unspecified: Secondary | ICD-10-CM | POA: Diagnosis not present

## 2019-02-16 DIAGNOSIS — R69 Illness, unspecified: Secondary | ICD-10-CM | POA: Diagnosis not present

## 2019-03-08 DIAGNOSIS — R69 Illness, unspecified: Secondary | ICD-10-CM | POA: Diagnosis not present

## 2019-03-13 ENCOUNTER — Other Ambulatory Visit: Payer: Self-pay | Admitting: Registered"

## 2019-03-13 DIAGNOSIS — Z20822 Contact with and (suspected) exposure to covid-19: Secondary | ICD-10-CM

## 2019-03-15 LAB — NOVEL CORONAVIRUS, NAA: SARS-CoV-2, NAA: NOT DETECTED

## 2019-04-04 DIAGNOSIS — R69 Illness, unspecified: Secondary | ICD-10-CM | POA: Diagnosis not present

## 2019-05-09 DIAGNOSIS — D225 Melanocytic nevi of trunk: Secondary | ICD-10-CM | POA: Diagnosis not present

## 2019-05-09 DIAGNOSIS — L814 Other melanin hyperpigmentation: Secondary | ICD-10-CM | POA: Diagnosis not present

## 2019-05-09 DIAGNOSIS — L821 Other seborrheic keratosis: Secondary | ICD-10-CM | POA: Diagnosis not present

## 2019-05-09 DIAGNOSIS — D2272 Melanocytic nevi of left lower limb, including hip: Secondary | ICD-10-CM | POA: Diagnosis not present

## 2019-05-09 DIAGNOSIS — Z23 Encounter for immunization: Secondary | ICD-10-CM | POA: Diagnosis not present

## 2019-05-09 DIAGNOSIS — B078 Other viral warts: Secondary | ICD-10-CM | POA: Diagnosis not present

## 2019-05-18 DIAGNOSIS — H524 Presbyopia: Secondary | ICD-10-CM | POA: Diagnosis not present

## 2019-05-18 DIAGNOSIS — H5203 Hypermetropia, bilateral: Secondary | ICD-10-CM | POA: Diagnosis not present

## 2019-05-18 DIAGNOSIS — H52223 Regular astigmatism, bilateral: Secondary | ICD-10-CM | POA: Diagnosis not present

## 2019-06-12 ENCOUNTER — Ambulatory Visit: Payer: Medicare HMO | Attending: Internal Medicine

## 2019-06-12 DIAGNOSIS — Z20822 Contact with and (suspected) exposure to covid-19: Secondary | ICD-10-CM

## 2019-06-13 LAB — NOVEL CORONAVIRUS, NAA: SARS-CoV-2, NAA: NOT DETECTED

## 2019-07-04 ENCOUNTER — Ambulatory Visit: Payer: Medicare HMO | Attending: Internal Medicine

## 2019-07-04 ENCOUNTER — Ambulatory Visit: Payer: Medicare HMO

## 2019-07-04 DIAGNOSIS — Z20822 Contact with and (suspected) exposure to covid-19: Secondary | ICD-10-CM

## 2019-07-05 LAB — NOVEL CORONAVIRUS, NAA: SARS-CoV-2, NAA: NOT DETECTED

## 2019-07-13 ENCOUNTER — Ambulatory Visit: Payer: Medicare HMO | Attending: Internal Medicine

## 2019-07-13 ENCOUNTER — Ambulatory Visit: Payer: Medicare HMO

## 2019-07-13 DIAGNOSIS — Z23 Encounter for immunization: Secondary | ICD-10-CM | POA: Insufficient documentation

## 2019-07-13 NOTE — Progress Notes (Signed)
   Covid-19 Vaccination Clinic  Name:  Kathy Drake    MRN: [id number] DOB: 01-May-1951  07/13/2019  Ms. [name] was observed post Covid-19 immunization for 15 minutes without incidence. She was provided with Vaccine Information Sheet and instruction to access the V-Safe system.   Ms. [name] was instructed to call 911 with any severe reactions post vaccine: Marland Kitchen Difficulty breathing  . Swelling of your face and throat  . A fast heartbeat  . A bad rash all over your body  . Dizziness and weakness    Immunizations Administered    Name Date Dose VIS Date Route   Pfizer COVID-19 Vaccine 07/13/2019  3:02 PM 0.3 mL 05/19/2019 Intramuscular   Manufacturer: [place]   Lot: [id number]   [place]: [id number]

## 2019-07-21 ENCOUNTER — Ambulatory Visit: Payer: Medicare HMO

## 2019-08-08 ENCOUNTER — Ambulatory Visit: Payer: Medicare HMO | Attending: Internal Medicine

## 2019-08-08 DIAGNOSIS — Z23 Encounter for immunization: Secondary | ICD-10-CM | POA: Insufficient documentation

## 2019-08-08 NOTE — Progress Notes (Signed)
   Covid-19 Vaccination Clinic  Name:  Kathy Drake    MRN: [id number] DOB: 12-Nov-1950  08/08/2019  Kathy Drake was observed post Covid-19 immunization for 15 minutes without incident. She was provided with Vaccine Information Sheet and instruction to access the V-Safe system.   Kathy Drake was instructed to call 911 with any severe reactions post vaccine: Marland Kitchen Difficulty breathing  . Swelling of face and throat  . A fast heartbeat  . A bad rash all over body  . Dizziness and weakness   Immunizations Administered    Name Date Dose VIS Date Route   Pfizer COVID-19 Vaccine 08/08/2019  9:53 AM 0.3 mL 05/19/2019 Intramuscular   Manufacturer: [place]   Lot: [id number]   [place]: [id number]

## 2019-10-11 DIAGNOSIS — R69 Illness, unspecified: Secondary | ICD-10-CM | POA: Diagnosis not present

## 2019-10-19 DIAGNOSIS — Z8542 Personal history of malignant neoplasm of other parts of uterus: Secondary | ICD-10-CM | POA: Diagnosis not present

## 2019-10-19 DIAGNOSIS — R7301 Impaired fasting glucose: Secondary | ICD-10-CM | POA: Diagnosis not present

## 2019-10-19 DIAGNOSIS — E7800 Pure hypercholesterolemia, unspecified: Secondary | ICD-10-CM | POA: Diagnosis not present

## 2019-10-19 DIAGNOSIS — E559 Vitamin D deficiency, unspecified: Secondary | ICD-10-CM | POA: Diagnosis not present

## 2019-10-19 DIAGNOSIS — K458 Other specified abdominal hernia without obstruction or gangrene: Secondary | ICD-10-CM | POA: Diagnosis not present

## 2019-10-19 DIAGNOSIS — R69 Illness, unspecified: Secondary | ICD-10-CM | POA: Diagnosis not present

## 2019-10-19 DIAGNOSIS — Z79899 Other long term (current) drug therapy: Secondary | ICD-10-CM | POA: Diagnosis not present

## 2019-10-19 DIAGNOSIS — Z0000 Encounter for general adult medical examination without abnormal findings: Secondary | ICD-10-CM | POA: Diagnosis not present

## 2019-10-19 DIAGNOSIS — Z90710 Acquired absence of both cervix and uterus: Secondary | ICD-10-CM | POA: Diagnosis not present

## 2019-10-25 DIAGNOSIS — R69 Illness, unspecified: Secondary | ICD-10-CM | POA: Diagnosis not present

## 2019-10-31 ENCOUNTER — Other Ambulatory Visit: Payer: Self-pay | Admitting: Family Medicine

## 2019-10-31 DIAGNOSIS — E2839 Other primary ovarian failure: Secondary | ICD-10-CM

## 2019-11-02 ENCOUNTER — Other Ambulatory Visit: Payer: Self-pay | Admitting: Family Medicine

## 2019-11-02 DIAGNOSIS — Z1231 Encounter for screening mammogram for malignant neoplasm of breast: Secondary | ICD-10-CM

## 2019-11-09 DIAGNOSIS — R69 Illness, unspecified: Secondary | ICD-10-CM | POA: Diagnosis not present

## 2019-11-16 DIAGNOSIS — R69 Illness, unspecified: Secondary | ICD-10-CM | POA: Diagnosis not present

## 2019-12-17 DIAGNOSIS — Z20822 Contact with and (suspected) exposure to covid-19: Secondary | ICD-10-CM | POA: Diagnosis not present

## 2019-12-19 DIAGNOSIS — Z20828 Contact with and (suspected) exposure to other viral communicable diseases: Secondary | ICD-10-CM | POA: Diagnosis not present

## 2020-01-08 ENCOUNTER — Inpatient Hospital Stay: Payer: Medicare HMO

## 2020-01-08 ENCOUNTER — Encounter: Payer: Self-pay | Admitting: Genetic Counselor

## 2020-01-08 ENCOUNTER — Inpatient Hospital Stay: Payer: Medicare HMO | Attending: Genetic Counselor | Admitting: Genetic Counselor

## 2020-01-08 ENCOUNTER — Other Ambulatory Visit: Payer: Self-pay

## 2020-01-08 ENCOUNTER — Other Ambulatory Visit: Payer: Self-pay | Admitting: Genetic Counselor

## 2020-01-08 DIAGNOSIS — Z8042 Family history of malignant neoplasm of prostate: Secondary | ICD-10-CM | POA: Diagnosis not present

## 2020-01-08 DIAGNOSIS — Z8041 Family history of malignant neoplasm of ovary: Secondary | ICD-10-CM

## 2020-01-08 DIAGNOSIS — Z800 Family history of malignant neoplasm of digestive organs: Secondary | ICD-10-CM

## 2020-01-08 DIAGNOSIS — Z8542 Personal history of malignant neoplasm of other parts of uterus: Secondary | ICD-10-CM

## 2020-01-08 DIAGNOSIS — Z8049 Family history of malignant neoplasm of other genital organs: Secondary | ICD-10-CM

## 2020-01-08 NOTE — Progress Notes (Signed)
REFERRING PROVIDER: Jonathon Jordan, MD [address],  [place] [address]  PRIMARY PROVIDER:  System, Pcp Not In  PRIMARY REASON FOR VISIT:  1. Personal history of malignant neoplasm of other parts of uterus   2. Family history of uterine cancer   3. Family history of prostate cancer   4. Family history of ovarian cancer   5. Family history of colon cancer      HISTORY OF PRESENT ILLNESS:   Kathy Drake, a 69 y.o. female, was seen for a  cancer genetics consultation at the request of Dr. Stephanie Acre due to a personal and family history of cancer.  Kathy Drake presents to clinic today to discuss the possibility of a hereditary predisposition to cancer, genetic testing, and to further clarify her future cancer risks, as well as potential cancer risks for family members.   In 2007, at the age of 25, Kathy Drake was diagnosed with uterine cancer. The treatment plan included TAH/BSO.  Her sister was also diagnosed with uterine cancer and underwent genetic testing.  She was found to carry a pathogenic variant in MSH6, diagnosing her with Lynch syndrome.       CANCER HISTORY:  Oncology History   No history exists.     RISK FACTORS:  Menarche was at age 87.  First live birth at age 67.  OCP use for approximately 12 years.  Ovaries intact: no.  Hysterectomy: yes.  Menopausal status: postmenopausal.  Colonoscopy: yes; a few polyps. Mammogram within the last year: yes. Number of breast biopsies: 0. Up to date with pelvic exams: yes. Any excessive radiation exposure in the past: no  Past Medical History:  Diagnosis Date  . Family history of colon cancer   . Family history of prostate cancer   . Family history of uterine cancer     Social History   Socioeconomic History  . Marital status: Married    Spouse name: Not on file  . Number of children: Not on file  . Years of education: Not on file  . Highest education level: Not on file  Occupational  History  . Not on file  Tobacco Use  . Smoking status: Not on file  Substance and Sexual Activity  . Alcohol use: Not on file  . Drug use: Not on file  . Sexual activity: Not on file  Other Topics Concern  . Not on file  Social History Narrative  . Not on file   Social Determinants of Health   Financial Resource Strain:   . Difficulty of Paying Living Expenses:   Food Insecurity:   . Worried About Charity fundraiser in the Last Year:   . Arboriculturist in the Last Year:   Transportation Needs:   . Film/video editor (Medical):   Marland Kitchen Lack of Transportation (Non-Medical):   Physical Activity:   . Days of Exercise per Week:   . Minutes of Exercise per Session:   Stress:   . Feeling of Stress :   Social Connections:   . Frequency of Communication with Friends and Family:   . Frequency of Social Gatherings with Friends and Family:   . Attends Religious Services:   . Active Member of Clubs or Organizations:   . Attends Archivist Meetings:   Marland Kitchen Marital Status:      FAMILY HISTORY:  We obtained a detailed, 4-generation family history.  Significant diagnoses are listed below: Family History  Problem Relation Age of  Onset  . Colon cancer Mother 10  . Ovarian cancer Mother        dx in her 35s  . Prostate cancer Father   . Endometrial cancer Sister 59       Lynch syndrome due to MSH6  . Lymphoma Paternal Uncle   . Dementia Maternal Grandfather   . Cancer Paternal Grandfather        unknown  . Cancer Paternal Uncle        unknown  . Melanoma Paternal Uncle   . Melanoma Paternal Uncle   . Throat cancer Paternal Uncle     The patient has a son and daughter who are cancer free.  She has three sisters, one who has uterine cancer due to Lynch syndrome.  The patient's parents are deceased.  The patient's mother had colon cancer at 68 and again in her 69's, and ovarian cancer in her 5's.  She had four brothers and one sister, with no reported cancer in any of  these individuals or their children.  The patient's maternal grandparents are deceased.  The patient's father had prostate cancer.  He had six brothers, one had Hodgkin's lymphoma, a second had throat cancer, two brothers had melanoma and fifth had an unknown cancer.  The patient's paternal grandfather had an unknown cancer as well.  Kathy Drake is aware of previous family history of genetic testing for hereditary cancer risks. Patient's maternal ancestors are of [place] and [place] descent, and paternal ancestors are of [place] descent. There is no reported Ashkenazi Jewish ancestry. There is no known consanguinity.  GENETIC COUNSELING ASSESSMENT: Kathy Drake is a 69 y.o. female with a personal and family history of cancer which is somewhat suggestive of a hereditary syndrome such as Lynch syndrome and predisposition to cancer given the family history of cancer and the known familial mutation in MSH6. We, therefore, discussed and recommended the following at today's visit.   DISCUSSION: We discussed that 3 - 5% of uterine cancer is hereditary, with most cases associated with Lynch syndrome.  There are other genes that can be associated with hereditary uterine cancer syndromes.  These include PTEN and a couple others.  Based on her sister's diagnosis of Lynch syndrome due to an MSH6 pathogenic variant, she has a 50% chance of also having this same pathogenic variant.  Based on her personal history of uterine cancer, this is more likely going to be confirmatory for Lynch syndrome.  We discussed that testing is beneficial for several reasons including knowing how to follow individuals  and understand if other family members could be at risk for cancer and allow them to undergo genetic testing.   We reviewed the characteristics, features and inheritance patterns of hereditary cancer syndromes. We also discussed genetic testing, including the appropriate family members to test, the process of testing, insurance  coverage and turn-around-time for results. We discussed the implications of a negative, positive, carrier and/or variant of uncertain significant result. We recommended Kathy Drake pursue genetic testing for the CustomNext-cancer+RNAinsight gene panel. The CustomNext-Cancer gene panel offered by [facility] and includes sequencing and rearrangement analysis for the following 91 genes: AIP, ALK, APC*, ATM*, AXIN2, BAP1, BARD1, BLM, BMPR1A, BRCA1*, BRCA2*, BRIP1*, CDC73, CDH1*, CDK4, CDKN1B, CDKN2A, CHEK2*, CTNNA1, DICER1, FANCC, FH, FLCN, GALNT12, KIF1B, LZTR1, MAX, MEN1, MET, MLH1*, MRE11A, MSH2*, MSH3, MSH6*, MUTYH*, NBN, NF1*, NF2, NTHL1, PALB2*, PHOX2B, PMS2*, POT1, PRKAR1A, PTCH1, PTEN*, RAD50, RAD51C*, RAD51D*, RB1, RECQL, RET, SDHA, SDHAF2, SDHB, SDHC, SDHD, SMAD4, SMARCA4, SMARCB1, SMARCE1, STK11,  SUFU, TMEM127, TP53*, TSC1, TSC2, VHL and XRCC2 (sequencing and deletion/duplication); CASR, CFTR, CPA1, CTRC, EGFR, EGLN1, FAM175A, HOXB13, KIT, MITF, MLH3, PALLD, PDGFRA, POLD1, POLE, PRSS1, RINT1, RPS20, SPINK1 and TERT (sequencing only); EPCAM and GREM1 (deletion/duplication only). DNA and RNA analyses performed for * genes.   Based on Kathy Drake personal and family history of cancer, she meets medical criteria for genetic testing. Despite that she meets criteria, she may still have an out of pocket cost. We discussed that if her out of pocket cost for testing is over $100, the laboratory will call and confirm whether she wants to proceed with testing.  If the out of pocket cost of testing is less than $100 she will be billed by the genetic testing laboratory.   PLAN: After considering the risks, benefits, and limitations, Kathy Drake provided informed consent to pursue genetic testing and the blood sample was sent to [facility] for analysis of the CustomNext-Cancer+RNAinsight. Results should be available within approximately 2-3 weeks' time, at which point they will be disclosed by  telephone to Kathy Drake, as will any additional recommendations warranted by these results. Kathy Drake will receive a summary of her genetic counseling visit and a copy of her results once available. This information will also be available in Epic.   Lastly, we encouraged Kathy Drake to remain in contact with cancer genetics annually so that we can continuously update the family history and inform her of any changes in cancer genetics and testing that may be of benefit for this family.   Kathy Drake questions were answered to her satisfaction today. Our contact information was provided should additional questions or concerns arise. Thank you for the referral and allowing [place] to share in the care of your patient.   Kathy Drake P. Florene Glen, [place], [facility] Licensed, Insurance risk surveyor [email or web address] phone: [phone]  The patient was seen for a total of 45 minutes in face-to-face genetic counseling.  This patient was discussed with Drs. Magrinat, Lindi Adie and/or Burr Medico who agrees with the above.    _______________________________________________________________________ For Office Staff:  Number of people involved in session: 1 Was an Intern/ student involved with case: no

## 2020-01-09 LAB — GENETIC SCREENING ORDER

## 2020-01-15 ENCOUNTER — Ambulatory Visit
Admission: RE | Admit: 2020-01-15 | Discharge: 2020-01-15 | Disposition: A | Discharge status: Home / Self Care | Payer: Medicare HMO | Source: Ambulatory Visit | Attending: Family Medicine | Admitting: Family Medicine

## 2020-01-15 ENCOUNTER — Other Ambulatory Visit: Payer: Self-pay

## 2020-01-15 DIAGNOSIS — M8589 Other specified disorders of bone density and structure, multiple sites: Secondary | ICD-10-CM | POA: Diagnosis not present

## 2020-01-15 DIAGNOSIS — E2839 Other primary ovarian failure: Secondary | ICD-10-CM

## 2020-01-15 DIAGNOSIS — Z780 Asymptomatic menopausal state: Secondary | ICD-10-CM | POA: Diagnosis not present

## 2020-01-15 DIAGNOSIS — Z1231 Encounter for screening mammogram for malignant neoplasm of breast: Secondary | ICD-10-CM | POA: Diagnosis not present

## 2020-01-26 ENCOUNTER — Telehealth: Payer: Self-pay | Admitting: Genetic Counselor

## 2020-01-26 ENCOUNTER — Encounter: Payer: Self-pay | Admitting: Genetic Counselor

## 2020-01-26 DIAGNOSIS — Z1379 Encounter for other screening for genetic and chromosomal anomalies: Secondary | ICD-10-CM | POA: Insufficient documentation

## 2020-01-26 DIAGNOSIS — Z1509 Genetic susceptibility to other malignant neoplasm: Secondary | ICD-10-CM | POA: Insufficient documentation

## 2020-01-26 NOTE — Telephone Encounter (Signed)
LM on VM that I was calling about results.  Gave CB instructions.

## 2020-01-29 NOTE — Telephone Encounter (Signed)
Returned patient call.  Confirmed that she had the MSH6 pathogenic variant identified in her sister.  Patient scheduled a return appointment for 9/13 at 1 PM.

## 2020-02-19 ENCOUNTER — Inpatient Hospital Stay: Payer: Medicare HMO | Attending: Genetic Counselor | Admitting: Genetic Counselor

## 2020-02-19 ENCOUNTER — Other Ambulatory Visit: Payer: Self-pay

## 2020-02-19 ENCOUNTER — Encounter: Payer: Self-pay | Admitting: Genetic Counselor

## 2020-02-19 DIAGNOSIS — Z1509 Genetic susceptibility to other malignant neoplasm: Secondary | ICD-10-CM

## 2020-02-19 DIAGNOSIS — Z1379 Encounter for other screening for genetic and chromosomal anomalies: Secondary | ICD-10-CM | POA: Diagnosis not present

## 2020-02-19 DIAGNOSIS — R69 Illness, unspecified: Secondary | ICD-10-CM | POA: Diagnosis not present

## 2020-02-19 NOTE — Progress Notes (Signed)
REFERRING PROVIDER: Jonathon Jordan, MD [address],  [place] [address]  PRIMARY PROVIDER:  Juanita Craver, MD  PRIMARY REASON FOR VISIT:  1. Genetic testing   2. MSH6-related Lynch syndrome [facility])     GENETIC TEST RESULTS   Patient Name: Kathy Drake Patient Age: 69 y.o. Encounter Date: 02/19/2020  HPI: Ms. Coventry was previously seen in the [place] clinic due to a family of cancer and concerns regarding a hereditary predisposition to cancer. Please refer to our prior cancer genetics clinic note for more information regarding Ms. Wunder's medical, social and family histories, and our assessment and recommendations, at the time. Ms. Renville recent genetic test results were disclosed to her, as were recommendations warranted by these results. These results and recommendations are discussed in more detail below.   FAMILY HISTORY:  We obtained a detailed, 4-generation family history.  Significant diagnoses are listed below: Family History  Problem Relation Age of Onset  . Colon cancer Mother 70  . Ovarian cancer Mother        dx in her 43s  . Prostate cancer Father   . Endometrial cancer Sister 30       Lynch syndrome due to MSH6  . Lymphoma Paternal Uncle   . Dementia Maternal Grandfather   . Cancer Paternal Grandfather        unknown  . Cancer Paternal Uncle        unknown  . Melanoma Paternal Uncle   . Melanoma Paternal Uncle   . Throat cancer Paternal Uncle     The patient has a son and daughter who are cancer free.  She has three sisters, one who has uterine cancer due to Lynch syndrome.  The patient's parents are deceased.  The patient's mother had colon cancer at 56 and again in her 79's, and ovarian cancer in her 42's.  She had four brothers and one sister, with no reported cancer in any of these individuals or their children.  The patient's maternal grandparents are deceased.  The patient's father had prostate cancer.   He had six brothers, one had Hodgkin's lymphoma, a second had throat cancer, two brothers had melanoma and fifth had an unknown cancer.  The patient's paternal grandfather had an unknown cancer as well.  Ms. Brummond is aware of previous family history of genetic testing for hereditary cancer risks. Patient's maternal ancestors are of [place] and [place] descent, and paternal ancestors are of [place] descent. There is no reported Ashkenazi Jewish ancestry. There is no known consanguinity.   GENETIC TESTING:  At the time of Ms. Mattern's visit, we recommended she pursue genetic testing of the CustomNext-Cancer+RNAinsight test. The genetic testing was reported out on January 25, 2020 through the CustomNext-Cancer+RNAinsight Cancer Panel offered by [facility] which identified a single, heterozygous pathogenic gene mutation called MSH6, [phone] confirming the diagnosis of Lynch syndrome.There were no deleterious mutations in  AIP, ALK, APC*, ATM*, AXIN2, BAP1, BARD1, BLM, BMPR1A, BRCA1*, BRCA2*, BRIP1*, CDC73, CDH1*, CDK4, CDKN1B, CDKN2A, CHEK2*, CTNNA1, DICER1, FANCC, FH, FLCN, GALNT12, KIF1B, LZTR1, MAX, MEN1, MET, MLH1*, MRE11A, MSH2*, MSH3, MUTYH*, NBN, NF1*, NF2, NTHL1, PALB2*, PHOX2B, PMS2*, POT1, PRKAR1A, PTCH1, PTEN*, RAD50, RAD51C*, RAD51D*, RB1, RECQL, RET, SDHA, SDHAF2, SDHB, SDHC, SDHD, SMAD4, SMARCA4, SMARCB1, SMARCE1, STK11, SUFU, TMEM127, TP53*, TSC1, TSC2, VHL and XRCC2 (sequencing and deletion/duplication); CASR, CFTR, CPA1, CTRC, EGFR, EGLN1, FAM175A, HOXB13, KIT, MITF, MLH3, PALLD, PDGFRA, POLD1, POLE, PRSS1, RINT1, RPS20, SPINK1 and TERT (sequencing only); EPCAM and GREM1 (  deletion/duplication only). DNA and RNA analyses performed for * genes. .  A copy of the test report has been scanned into Epic for review.     SCREENING RECOMMENDATIONS: We discussed the implications of Lynch syndrome for Ms. Everly, and discussed who else in the family should have genetic testing. We  recommended Ms. Kuang follow management guidelines for Lynch syndrome; all of which are outlined below. These can be coordinated by Ms. Buttram's GI doctor or her primary provider.   1. Annual colonoscopy.   2. While there is no clear evidence to support screening for stomach and small bowel cancer, an upper endoscopy can be considered at 3-5 year intervals beginning at age 30-35. However, whether to have this screening is best determined by the gastroenterologist.   3. Annual urinalysis.   4. For patients with colorectal adenocarcinoma, segmental or extended colectomy may be considered.  For women with Lynch syndrome, unlike the effective surveillance plan for colorectal cancer risk, there is no professional agreement regarding management for the increased risk of uterine and ovarian cancer. While Ms. Karman has a previous history of uterine cancer and no longer needs to be aware of cancer syndromes, other family members may need to be aware of the following issues. For endometrial cancer, women are encouraged to be aware of and immediately report dysfunctional or post-menopausal bleeding, which should then be followed up by an endometrial biopsy. In terms of surveillance, transvaginal ultrasound and endometrial biopsies have not been shown to be effective screening tools; however, they may be considered at the clinician's discretion. Importantly, transvaginal ultrasound is not recommended in pre-menopausal women due to variable presentations throughout a normal menstrual cycle. Endometrial biopsy can be considered every 1-2 years, but does not have proven benefit of reducing mortality in women with Lynch syndrome given the typically early presenting symptoms. Finally, while hysterectomy has not been shown to reduce endometrial cancer mortality, it does reduce incidence, and therefore, can be considered as a risk-reducing option.  Unfortunately, symptoms of early stage ovarian cancer are not as obvious as  endometrial cancer; however, women are encouraged to be aware of symptoms, such as pelvic or abdominal pain, bloating, increased abdominal girth, difficulty eating, feeling full from eating quickly, as well as increased urinary frequency and urgency. In terms of surveillance, transvaginal ultrasound examination and serum CA-125 have not been shown to be sufficiently sensitive or specific to support for routine screening. In terms of risk-reducing surgery, historically, women have been recommended to undergo a bilateral salpingo-oophorectomy (BSO) regardless of gene mutation; however, with the collection of gene-specific data, this recommendation has evolved. The most recent NCCN guidelines (v1.2021) notes the limited data to make specific recommendations for BSO in MSH6 and PMS2 mutation carriers. It is important to note that these guidelines do not cite the most recent article published with gene-specific risks (Dominguez-Valentin et al., 2019), which does quote a 3% ovarian cancer risk by age 67. Therefore, the decision to undergo a BSO, especially with this low-penetrance gene, should be individualized based on personal and family history.  However, we are available to help women and their providers establish an individualized surveillance plan. It is also important for women to understand the following:   1. Women should seek medical attention if they experience abnormal vaginal bleeding.  2. Some providers may still recommend vaginal ultrasounds, uterine biopsies (for uterine cancer risk) and/or CA-125 analysis ( for ovarian cancer risk), even though these have not been shown to be effective.  3. A hysterectomy with  removal of the ovaries and fallopian tubes could be considered once childbearing is completed (if planned).  FAMILY MEMBERS: Since we now know the mutation in Ms. Fernholz, we can test at-risk relatives to determine whether or not they have inherited the mutation and are at increased risk for  cancer.  It is important that all of Ms. Rigaud's relatives (both men and women) know of the presence of this gene mutation. Site-specific genetic testing can sort out who in the family is at risk and who is not. We will be happy to meet with any of the family members or refer them to a genetic counselor in their local area. To locate genetic counselors in other cities, individuals can visit the website of the [employer] (ArtistMovie.se) and Secretary/administrator for a Social worker by zip code.   Ms. Coggin children and siblings have a 50% chance to have inherited this mutation. We recommend they have genetic testing for this same mutation, as identifying the presence of this mutation would allow them to also take advantage of risk-reducing measures.   Children who inherit two mutations in the same Lynch gene, one mutation from each parent, are at-risk of a rare recessive condition called constitutional mismatch repair deficiency (CMMR-D) syndrome. If family members have this mutation, they may wish to have their partner tested if they are planning on having children.  PLAN: Ms. Pichette will need to be followed as high risk based on her diagnosis of Lynch syndrome.    Ms. Zillmer has identified a gastroenterologist that she plans to see for follow up for her diagnosis of Lynch syndrome.  This individual is Dr. Collene Mares.    Ms. Piltz has already had a TAH-BSO due to a history of endometrial cancer.  Therefore, she no longer has an identified a gynecologist that she would need to see for follow up for her diagnosis of Lynch syndrome. Ms. Elsayed has indicated she would like all follow-up for any residual concerns for GYN cancer to be performed by Dr. Stephanie Acre.   RESOURCES:  Ms. Klemz was given patient information about Lynch syndrome that was written by the national support group "it takes guts". She was also given several fact sheets and a family letter to help her family members learn more about Lynch  syndrome and to help them seek genetic testing.  We strongly encouraged Ms. Kisner to remain in contact with [place] in cancer genetics on an annual basis so we can update Ms. Sako's personal and family histories, and inform her of advances in cancer genetics that may be of benefit for the entire family. Ms. Ammon knows she is also welcome to call with any questions or concerns, at any time.   Dillian Feig P. Florene Glen, [place], [facility]  Licensed, Insurance risk surveyor [email or web address] phone: [phone]  The patient was seen for a total of 34 minutes in face-to-face genetic counseling.

## 2020-02-29 DIAGNOSIS — K5730 Diverticulosis of large intestine without perforation or abscess without bleeding: Secondary | ICD-10-CM | POA: Diagnosis not present

## 2020-02-29 DIAGNOSIS — Z1211 Encounter for screening for malignant neoplasm of colon: Secondary | ICD-10-CM | POA: Diagnosis not present

## 2020-02-29 DIAGNOSIS — E669 Obesity, unspecified: Secondary | ICD-10-CM | POA: Diagnosis not present

## 2020-02-29 DIAGNOSIS — Z1509 Genetic susceptibility to other malignant neoplasm: Secondary | ICD-10-CM | POA: Diagnosis not present

## 2020-02-29 DIAGNOSIS — Z86010 Personal history of colonic polyps: Secondary | ICD-10-CM | POA: Diagnosis not present

## 2020-02-29 DIAGNOSIS — Z800 Family history of malignant neoplasm of digestive organs: Secondary | ICD-10-CM | POA: Diagnosis not present

## 2020-03-05 DIAGNOSIS — R69 Illness, unspecified: Secondary | ICD-10-CM | POA: Diagnosis not present

## 2020-05-03 DIAGNOSIS — Z20828 Contact with and (suspected) exposure to other viral communicable diseases: Secondary | ICD-10-CM | POA: Diagnosis not present

## 2020-05-14 DIAGNOSIS — R69 Illness, unspecified: Secondary | ICD-10-CM | POA: Diagnosis not present

## 2020-05-15 DIAGNOSIS — Z86010 Personal history of colonic polyps: Secondary | ICD-10-CM | POA: Diagnosis not present

## 2020-05-15 DIAGNOSIS — Z1211 Encounter for screening for malignant neoplasm of colon: Secondary | ICD-10-CM | POA: Diagnosis not present

## 2020-05-15 DIAGNOSIS — Z800 Family history of malignant neoplasm of digestive organs: Secondary | ICD-10-CM | POA: Diagnosis not present

## 2020-05-15 DIAGNOSIS — K3189 Other diseases of stomach and duodenum: Secondary | ICD-10-CM | POA: Diagnosis not present

## 2020-05-15 DIAGNOSIS — Z1509 Genetic susceptibility to other malignant neoplasm: Secondary | ICD-10-CM | POA: Diagnosis not present

## 2020-05-15 DIAGNOSIS — K319 Disease of stomach and duodenum, unspecified: Secondary | ICD-10-CM | POA: Diagnosis not present

## 2020-06-04 DIAGNOSIS — Z20822 Contact with and (suspected) exposure to covid-19: Secondary | ICD-10-CM | POA: Diagnosis not present

## 2020-07-08 DIAGNOSIS — Z20828 Contact with and (suspected) exposure to other viral communicable diseases: Secondary | ICD-10-CM | POA: Diagnosis not present

## 2020-10-03 DIAGNOSIS — Z20828 Contact with and (suspected) exposure to other viral communicable diseases: Secondary | ICD-10-CM | POA: Diagnosis not present

## 2020-11-01 DIAGNOSIS — E7800 Pure hypercholesterolemia, unspecified: Secondary | ICD-10-CM | POA: Diagnosis not present

## 2020-11-01 DIAGNOSIS — E559 Vitamin D deficiency, unspecified: Secondary | ICD-10-CM | POA: Diagnosis not present

## 2020-11-01 DIAGNOSIS — Z79899 Other long term (current) drug therapy: Secondary | ICD-10-CM | POA: Diagnosis not present

## 2020-11-01 DIAGNOSIS — R7301 Impaired fasting glucose: Secondary | ICD-10-CM | POA: Diagnosis not present

## 2020-11-05 DIAGNOSIS — Z23 Encounter for immunization: Secondary | ICD-10-CM | POA: Diagnosis not present

## 2020-11-05 DIAGNOSIS — E559 Vitamin D deficiency, unspecified: Secondary | ICD-10-CM | POA: Diagnosis not present

## 2020-11-05 DIAGNOSIS — R69 Illness, unspecified: Secondary | ICD-10-CM | POA: Diagnosis not present

## 2020-11-05 DIAGNOSIS — Z8542 Personal history of malignant neoplasm of other parts of uterus: Secondary | ICD-10-CM | POA: Diagnosis not present

## 2020-11-05 DIAGNOSIS — Z79899 Other long term (current) drug therapy: Secondary | ICD-10-CM | POA: Diagnosis not present

## 2020-11-05 DIAGNOSIS — Z0000 Encounter for general adult medical examination without abnormal findings: Secondary | ICD-10-CM | POA: Diagnosis not present

## 2020-11-05 DIAGNOSIS — K5790 Diverticulosis of intestine, part unspecified, without perforation or abscess without bleeding: Secondary | ICD-10-CM | POA: Diagnosis not present

## 2020-11-05 DIAGNOSIS — R7301 Impaired fasting glucose: Secondary | ICD-10-CM | POA: Diagnosis not present

## 2020-11-05 DIAGNOSIS — E7800 Pure hypercholesterolemia, unspecified: Secondary | ICD-10-CM | POA: Diagnosis not present

## 2020-11-05 DIAGNOSIS — Z90710 Acquired absence of both cervix and uterus: Secondary | ICD-10-CM | POA: Diagnosis not present

## 2020-12-25 DIAGNOSIS — R0989 Other specified symptoms and signs involving the circulatory and respiratory systems: Secondary | ICD-10-CM | POA: Diagnosis not present

## 2020-12-25 DIAGNOSIS — R5383 Other fatigue: Secondary | ICD-10-CM | POA: Diagnosis not present

## 2020-12-25 DIAGNOSIS — U071 COVID-19: Secondary | ICD-10-CM | POA: Diagnosis not present

## 2021-01-15 DIAGNOSIS — E7800 Pure hypercholesterolemia, unspecified: Secondary | ICD-10-CM | POA: Diagnosis not present

## 2021-01-15 DIAGNOSIS — R7301 Impaired fasting glucose: Secondary | ICD-10-CM | POA: Diagnosis not present

## 2021-02-19 ENCOUNTER — Other Ambulatory Visit: Payer: Self-pay | Admitting: Family Medicine

## 2021-02-19 DIAGNOSIS — Z1231 Encounter for screening mammogram for malignant neoplasm of breast: Secondary | ICD-10-CM

## 2021-05-08 DIAGNOSIS — D225 Melanocytic nevi of trunk: Secondary | ICD-10-CM | POA: Diagnosis not present

## 2021-05-08 DIAGNOSIS — D2272 Melanocytic nevi of left lower limb, including hip: Secondary | ICD-10-CM | POA: Diagnosis not present

## 2021-05-08 DIAGNOSIS — L821 Other seborrheic keratosis: Secondary | ICD-10-CM | POA: Diagnosis not present

## 2021-05-08 DIAGNOSIS — L814 Other melanin hyperpigmentation: Secondary | ICD-10-CM | POA: Diagnosis not present

## 2021-05-09 ENCOUNTER — Other Ambulatory Visit: Payer: Self-pay

## 2021-05-09 ENCOUNTER — Ambulatory Visit
Admission: RE | Admit: 2021-05-09 | Discharge: 2021-05-09 | Disposition: A | Discharge status: Home / Self Care | Payer: Medicare HMO | Source: Ambulatory Visit | Attending: Family Medicine | Admitting: Family Medicine

## 2021-05-09 DIAGNOSIS — Z1231 Encounter for screening mammogram for malignant neoplasm of breast: Secondary | ICD-10-CM | POA: Diagnosis not present

## 2021-06-27 DIAGNOSIS — H524 Presbyopia: Secondary | ICD-10-CM | POA: Diagnosis not present

## 2021-06-27 DIAGNOSIS — Z0100 Encounter for examination of eyes and vision without abnormal findings: Secondary | ICD-10-CM | POA: Diagnosis not present

## 2022-01-27 IMAGING — MG MM DIGITAL SCREENING BILAT W/ TOMO AND CAD
8 series · 8 of 24 positions shown · non-contrast
Comparison: Previous exam(s).

CLINICAL DATA: Screening.

EXAM:
DIGITAL SCREENING BILATERAL MAMMOGRAM WITH TOMOSYNTHESIS AND CAD
TECHNIQUE: Bilateral screening digital craniocaudal and mediolateral oblique
mammograms were obtained. Bilateral screening digital breast
tomosynthesis was performed. The images were evaluated with
computer-aided detection.

[R CC synth-2D]
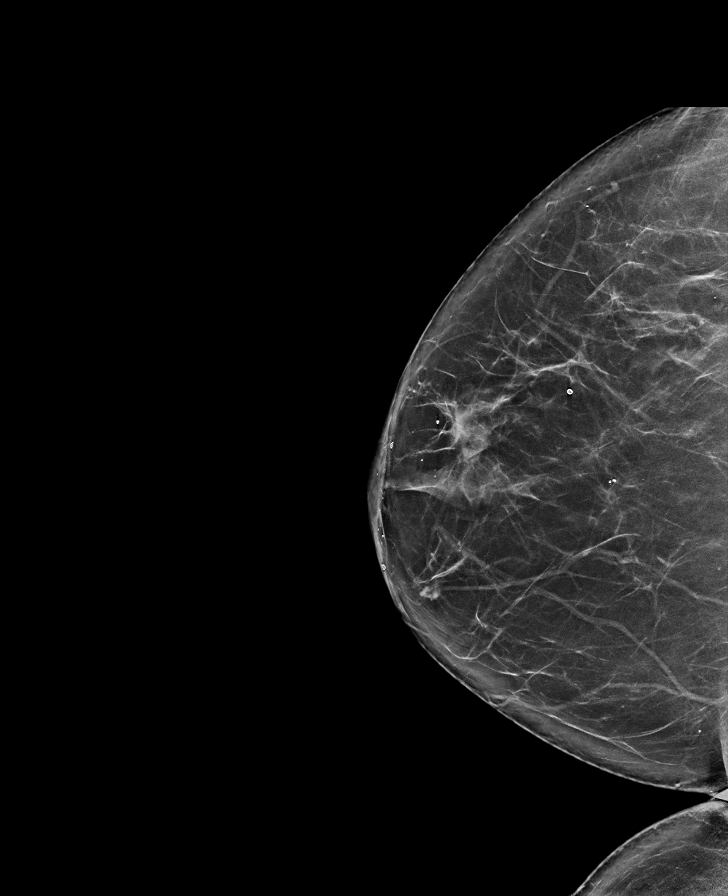

[L MLO synth-2D]
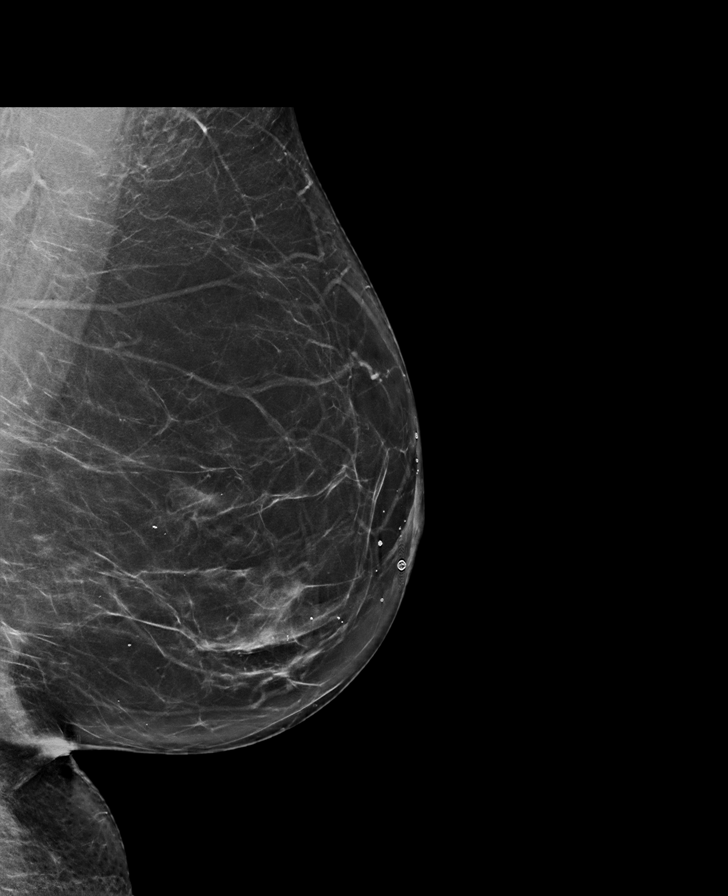

[R MLO synth-2D]
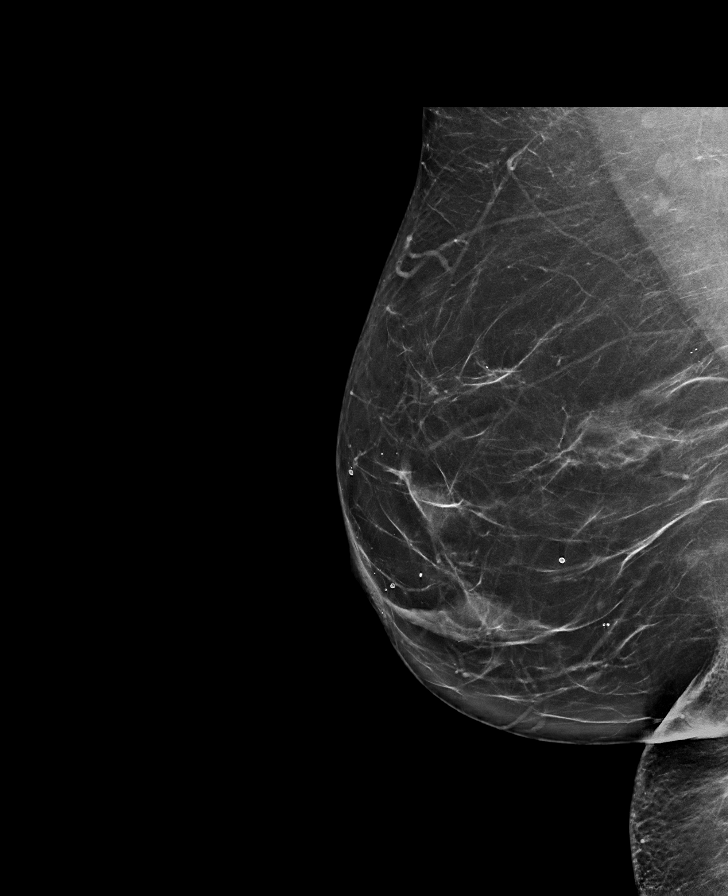

[L CC synth-2D]
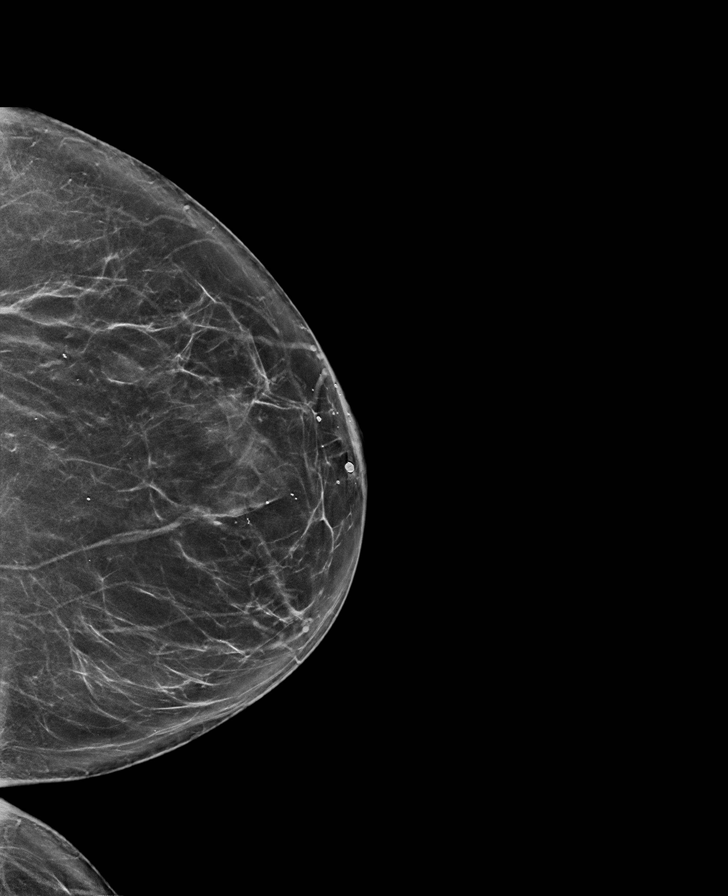

[L MLO tomo · tomo slice 39/77.0]
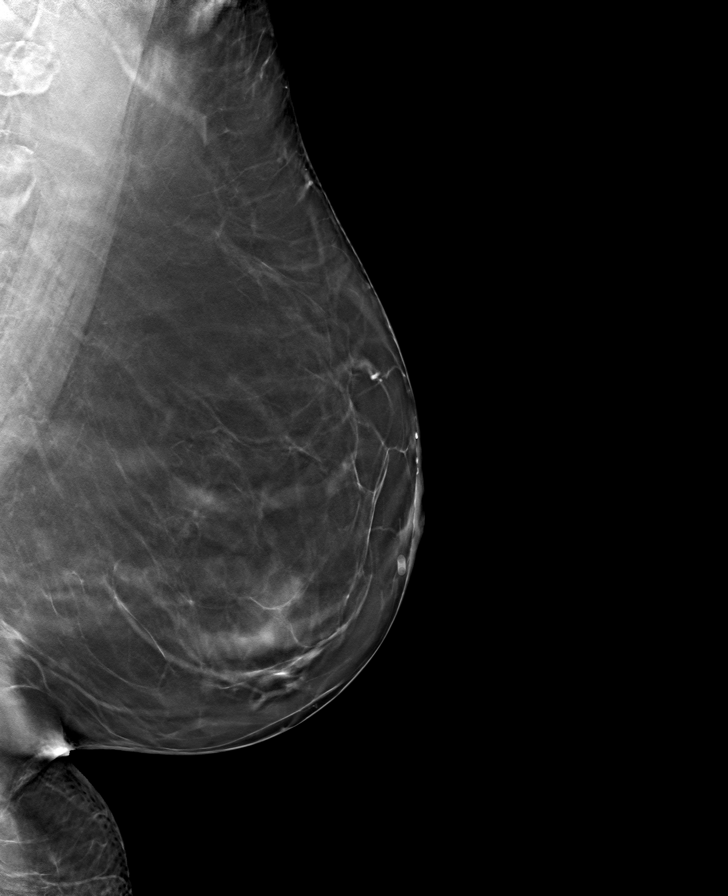

[R CC tomo · tomo slice 37/74.0]
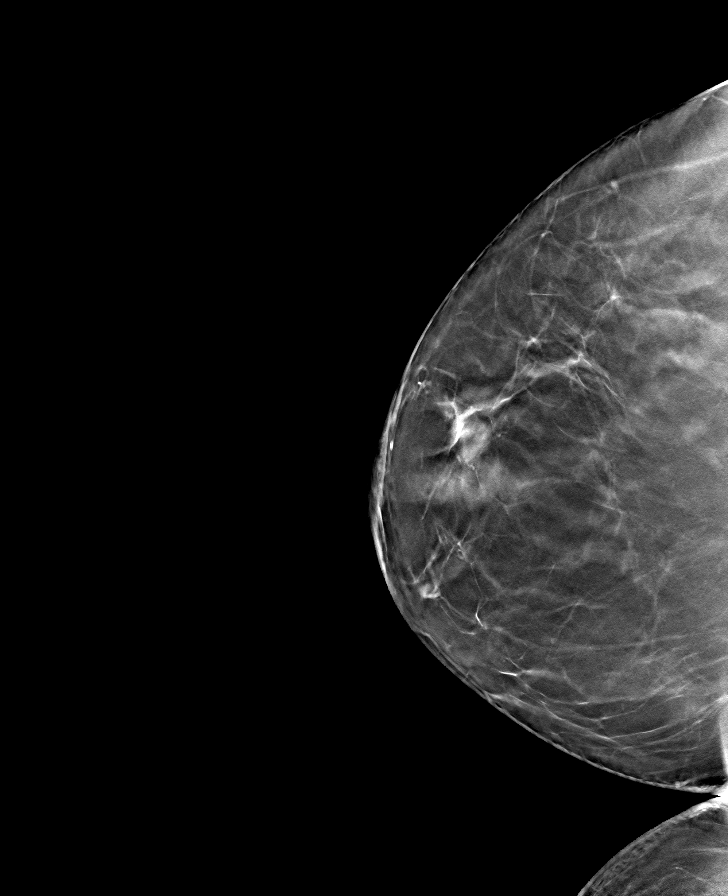

[R MLO tomo · tomo slice 40/79.0]
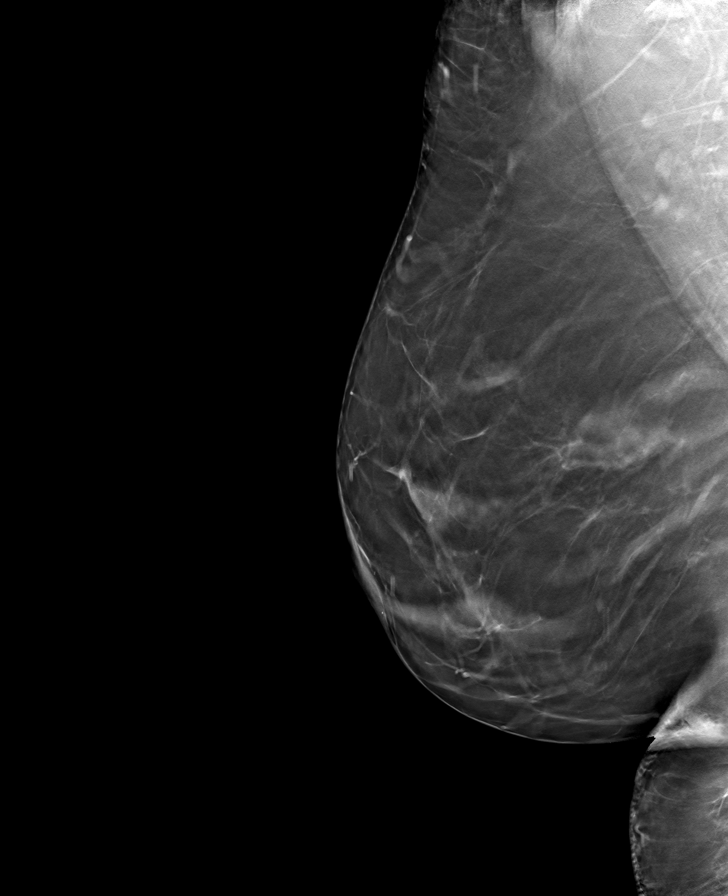

[L CC tomo · tomo slice 38/75.0]
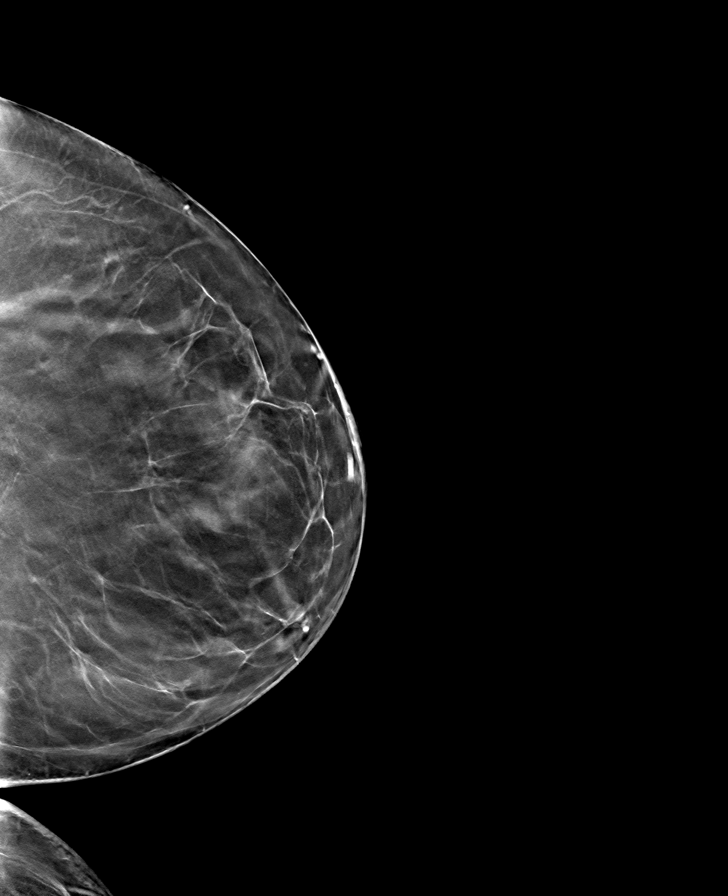

[8 of 24 positions shown; findings below may reference images not displayed]

ACR Breast Density Category b: There are scattered areas of
fibroglandular density.
FINDINGS: There are no findings suspicious for malignancy.
IMPRESSION: No mammographic evidence of malignancy. A result letter of this
screening mammogram will be mailed directly to the patient.

RECOMMENDATION:
Screening mammogram in one year. (Code:51-O-LD2)

BI-RADS CATEGORY  1: Negative.

## 2022-04-16 ENCOUNTER — Other Ambulatory Visit: Payer: Self-pay | Admitting: Family Medicine

## 2022-04-16 DIAGNOSIS — Z1231 Encounter for screening mammogram for malignant neoplasm of breast: Secondary | ICD-10-CM
